# Patient Record
Sex: Male | Born: 1970 | Race: White | Hispanic: No | Smoking: Never smoker
Health system: Southern US, Community
[De-identification: ages and names within clinical notes are randomized; demographics above are authoritative.]

## PROBLEM LIST (undated history)

## (undated) DIAGNOSIS — K219 Gastro-esophageal reflux disease without esophagitis: Secondary | ICD-10-CM

## (undated) HISTORY — PX: HERNIA REPAIR: SHX51

## (undated) HISTORY — DX: Gastro-esophageal reflux disease without esophagitis: K21.9

---

## 2007-12-13 HISTORY — PX: VASECTOMY: SHX75

## 2010-11-17 ENCOUNTER — Inpatient Hospital Stay (HOSPITAL_COMMUNITY)
Admission: EM | Admit: 2010-11-17 | Discharge: 2010-11-19 | Payer: Self-pay | Attending: Internal Medicine | Admitting: Internal Medicine

## 2010-11-17 ENCOUNTER — Emergency Department (HOSPITAL_BASED_OUTPATIENT_CLINIC_OR_DEPARTMENT_OTHER)
Admission: EM | Admit: 2010-11-17 | Discharge: 2010-11-17 | Payer: Self-pay | Source: Home / Self Care | Admitting: Emergency Medicine

## 2010-11-18 ENCOUNTER — Encounter (INDEPENDENT_AMBULATORY_CARE_PROVIDER_SITE_OTHER): Payer: Self-pay | Admitting: Internal Medicine

## 2011-02-22 LAB — CBC
HCT: 44.5 % (ref 39.0–52.0)
Hemoglobin: 13.9 g/dL (ref 13.0–17.0)
Hemoglobin: 15.9 g/dL (ref 13.0–17.0)
MCH: 30.3 pg (ref 26.0–34.0)
MCH: 30.4 pg (ref 26.0–34.0)
MCH: 30.8 pg (ref 26.0–34.0)
MCHC: 34.8 g/dL (ref 30.0–36.0)
MCHC: 35.8 g/dL (ref 30.0–36.0)
Platelets: 181 10*3/uL (ref 150–400)
Platelets: 222 10*3/uL (ref 150–400)
RBC: 4.48 MIL/uL (ref 4.22–5.81)
RBC: 5.17 MIL/uL (ref 4.22–5.81)
WBC: 7.3 10*3/uL (ref 4.0–10.5)

## 2011-02-22 LAB — COMPREHENSIVE METABOLIC PANEL
ALT: 133 U/L — ABNORMAL HIGH (ref 0–53)
AST: 113 U/L — ABNORMAL HIGH (ref 0–37)
Albumin: 3.4 g/dL — ABNORMAL LOW (ref 3.5–5.2)
Alkaline Phosphatase: 53 U/L (ref 39–117)
BUN: 3 mg/dL — ABNORMAL LOW (ref 6–23)
CO2: 25 mEq/L (ref 19–32)
Calcium: 8.5 mg/dL (ref 8.4–10.5)
Calcium: 8.6 mg/dL (ref 8.4–10.5)
Calcium: 9.4 mg/dL (ref 8.4–10.5)
Chloride: 104 mEq/L (ref 96–112)
Chloride: 107 mEq/L (ref 96–112)
Creatinine, Ser: 1 mg/dL (ref 0.4–1.5)
GFR calc Af Amer: >60 mL/min (ref >60)
GFR calc Af Amer: >60 mL/min (ref >60)
GFR calc Af Amer: >60 mL/min (ref >60)
GFR calc non Af Amer: >60 mL/min (ref >60)
GFR calc non Af Amer: >60 mL/min (ref >60)
Glucose, Bld: 121 mg/dL — ABNORMAL HIGH (ref 70–99)
Glucose, Bld: 147 mg/dL — ABNORMAL HIGH (ref 70–99)
Potassium: 3.4 mEq/L — ABNORMAL LOW (ref 3.5–5.1)
Sodium: 144 mEq/L (ref 135–145)
Total Bilirubin: 0.7 mg/dL (ref 0.3–1.2)
Total Bilirubin: 1.1 mg/dL (ref 0.3–1.2)
Total Bilirubin: 1.9 mg/dL — ABNORMAL HIGH (ref 0.3–1.2)
Total Protein: 6.3 g/dL (ref 6.0–8.3)

## 2011-02-22 LAB — URINALYSIS, ROUTINE W REFLEX MICROSCOPIC
Glucose, UA: NEGATIVE mg/dL
Ketones, ur: NEGATIVE mg/dL
Nitrite: NEGATIVE
Urobilinogen, UA: 0.2 mg/dL (ref 0.0–1.0)
pH: 7 (ref 5.0–8.0)

## 2011-02-22 LAB — DIFFERENTIAL
Basophils Relative: 1 % (ref 0–1)
Eosinophils Relative: 3 % (ref 0–5)
Lymphs Abs: 2.6 10*3/uL (ref 0.7–4.0)
Monocytes Absolute: 0.5 10*3/uL (ref 0.1–1.0)
Neutro Abs: 4.5 10*3/uL (ref 1.7–7.7)
Neutrophils Relative %: 57 % (ref 43–77)

## 2011-02-22 LAB — LIPASE, BLOOD: Lipase: 24 U/L (ref 11–59)

## 2014-12-12 HISTORY — PX: SHOULDER ARTHROSCOPY W/ ROTATOR CUFF REPAIR: SHX2400

## 2021-09-29 ENCOUNTER — Encounter: Payer: Self-pay | Admitting: Gastroenterology

## 2021-11-01 ENCOUNTER — Telehealth: Payer: Self-pay

## 2021-11-01 NOTE — Telephone Encounter (Signed)
Call to pt x2 for 3pm previsit, call goes right to vm.  Lm for pt to call and r/s previsit by 5 or call and r/s previsit and colonoscopy after that.

## 2021-11-02 ENCOUNTER — Encounter: Payer: Self-pay | Admitting: Family Medicine

## 2021-11-02 ENCOUNTER — Encounter: Payer: Self-pay | Admitting: Gastroenterology

## 2021-11-15 ENCOUNTER — Encounter: Payer: Self-pay | Admitting: Gastroenterology

## 2021-12-21 ENCOUNTER — Ambulatory Visit (AMBULATORY_SURGERY_CENTER): Payer: 59 | Admitting: *Deleted

## 2021-12-21 ENCOUNTER — Other Ambulatory Visit: Payer: Self-pay

## 2021-12-21 VITALS — Ht 69.0 in | Wt 195.0 lb

## 2021-12-21 DIAGNOSIS — Z1211 Encounter for screening for malignant neoplasm of colon: Secondary | ICD-10-CM

## 2021-12-21 MED ORDER — NA SULFATE-K SULFATE-MG SULF 17.5-3.13-1.6 GM/177ML PO SOLN: 1.0000 | ORAL | 0 refills | Status: DC

## 2021-12-21 NOTE — Progress Notes (Signed)
Patient's pre-visit was done today over the phone with the patient. Name,DOB and address verified. Patient denies any allergies to Eggs and Soy. Patient denies any problems with anesthesia/sedation. Patient is not taking any diet pills or blood thinners. No home Oxygen.   Prep instructions sent to pt's email-pt aware. Patient understands to call us back with any questions or concerns. Patient is aware of our care-partner policy and [id number]-16 safety protocol.   The patient is COVID-19 vaccinated.

## 2021-12-28 ENCOUNTER — Encounter: Payer: Self-pay | Admitting: Gastroenterology

## 2022-01-04 ENCOUNTER — Encounter: Payer: Self-pay | Admitting: Gastroenterology

## 2022-01-04 ENCOUNTER — Ambulatory Visit (AMBULATORY_SURGERY_CENTER): Payer: 59 | Admitting: Gastroenterology

## 2022-01-04 ENCOUNTER — Other Ambulatory Visit: Payer: Self-pay

## 2022-01-04 VITALS — BP 123/90 | HR 77 | Temp 98.0°F | Resp 15 | Ht 69.0 in | Wt 195.0 lb

## 2022-01-04 DIAGNOSIS — D123 Benign neoplasm of transverse colon: Secondary | ICD-10-CM | POA: Diagnosis not present

## 2022-01-04 DIAGNOSIS — Z1211 Encounter for screening for malignant neoplasm of colon: Secondary | ICD-10-CM

## 2022-01-04 DIAGNOSIS — D127 Benign neoplasm of rectosigmoid junction: Secondary | ICD-10-CM | POA: Diagnosis not present

## 2022-01-04 MED ORDER — SODIUM CHLORIDE 0.9 % IV SOLN: 500.0000 mL | Freq: Once | INTRAVENOUS | Status: DC

## 2022-01-04 NOTE — Patient Instructions (Signed)
Discharge instructions given. Handouts on polyps and hemorrhoids. Resume previous medications. YOU HAD AN ENDOSCOPIC PROCEDURE TODAY AT THE [facility]:   Refer to the procedure report that was given to you for any specific questions about what was found during the examination.  If the procedure report does not answer your questions, please call your gastroenterologist to clarify.  If you requested that your care partner not be given the details of your procedure findings, then the procedure report has been included in a sealed envelope for you to review at your convenience later.  YOU SHOULD EXPECT: Some feelings of bloating in the abdomen. Passage of more gas than usual.  Walking can help get rid of the air that was put into your GI tract during the procedure and reduce the bloating. If you had a lower endoscopy (such as a colonoscopy or flexible sigmoidoscopy) you may notice spotting of blood in your stool or on the toilet paper. If you underwent a bowel prep for your procedure, you may not have a normal bowel movement for a few days.  Please Note:  You might notice some irritation and congestion in your nose or some drainage.  This is from the oxygen used during your procedure.  There is no need for concern and it should clear up in a day or so.  SYMPTOMS TO REPORT IMMEDIATELY:  Following lower endoscopy (colonoscopy or flexible sigmoidoscopy):  Excessive amounts of blood in the stool  Significant tenderness or worsening of abdominal pains  Swelling of the abdomen that is new, acute  Fever of 100F or higher   For urgent or emergent issues, a gastroenterologist can be reached at any hour by calling [phone]. Do not use MyChart messaging for urgent concerns.    DIET:  We do recommend a small meal at first, but then you may proceed to your regular diet.  Drink plenty of fluids but you should avoid alcoholic beverages for 24 hours.  ACTIVITY:  You should plan to take it  easy for the rest of today and you should NOT DRIVE or use heavy machinery until tomorrow (because of the sedation medicines used during the test).    FOLLOW UP: Our staff will call the number listed on your records 48-72 hours following your procedure to check on you and address any questions or concerns that you may have regarding the information given to you following your procedure. If we do not reach you, we will leave a message.  We will attempt to reach you two times.  During this call, we will ask if you have developed any symptoms of COVID 19. If you develop any symptoms (ie: fever, flu-like symptoms, shortness of breath, cough etc.) before then, please call [phone].  If you test positive for Covid 19 in the 2 weeks post procedure, please call and report this information to us.    If any biopsies were taken you will be contacted by phone or by letter within the next 1-3 weeks.  Please call us at [phone] if you have not heard about the biopsies in 3 weeks.    SIGNATURES/CONFIDENTIALITY: You and/or your care partner have signed paperwork which will be entered into your electronic medical record.  These signatures attest to the fact that that the information above on your After Visit Summary has been reviewed and is understood.  Full responsibility of the confidentiality of this discharge information lies with you and/or your care-partner.  

## 2022-01-04 NOTE — Op Note (Signed)
[facility] Patient Name: Richard Rowland Procedure Date: 01/04/2022 8:48 AM MRN: [id number] Endoscopist: Nicki Reaper E. Candis Rowland , MD Age: 51 Referring MD:  Date of Birth: 11-24-1971 Gender: Male Account #: [id number] Procedure:                Colonoscopy Indications:              Screening for colorectal malignant neoplasm, This                            is the patient's first colonoscopy Medicines:                Monitored Anesthesia Care Procedure:                Pre-Anesthesia Assessment:                           - Prior to the procedure, a History and Physical                            was performed, and patient medications and                            allergies were reviewed. The patient's tolerance of                            previous anesthesia was also reviewed. The risks                            and benefits of the procedure and the sedation                            options and risks were discussed with the patient.                            All questions were answered, and informed consent                            was obtained. Prior Anticoagulants: The patient has                            taken no previous anticoagulant or antiplatelet                            agents. ASA Grade Assessment: II - A patient with                            mild systemic disease. After reviewing the risks                            and benefits, the patient was deemed in                            satisfactory condition to undergo the procedure.  After obtaining informed consent, the colonoscope                            was passed under direct vision. Throughout the                            procedure, the patient's blood pressure, pulse, and                            oxygen saturations were monitored continuously. The                            Olympus CF-HQ190L (Serial# [id number]) Colonoscope was                            introduced through  the anus and advanced to the the                            terminal ileum, with identification of the                            appendiceal orifice and IC valve. The colonoscopy                            was performed without difficulty. The patient                            tolerated the procedure well. The quality of the                            bowel preparation was excellent. The terminal                            ileum, ileocecal valve, appendiceal orifice, and                            rectum were photographed. Scope In: 9:04:56 AM Scope Out: 9:24:09 AM Scope Withdrawal Time: 0 hours 15 minutes 30 seconds  Total Procedure Duration: 0 hours 19 minutes 13 seconds  Findings:                 The perianal and digital rectal examinations were                            normal. Pertinent negatives include normal                            sphincter tone and no palpable rectal lesions.                           A 5 mm polyp was found in the splenic flexure. The                            polyp was sessile. The polyp was removed with a  cold snare. Resection and retrieval were complete.                            Estimated blood loss was minimal.                           A 20 mm polypoid lesion was found in the                            recto-sigmoid colon. The lesion was pedunculated.                            No bleeding was present. This was biopsied with a                            cold forceps for histology. Estimated blood loss                            was minimal.                           The exam was otherwise normal throughout the                            examined colon.                           The terminal ileum appeared normal.                           Non-bleeding internal hemorrhoids were found during                            retroflexion. The hemorrhoids were Grade I                            (internal hemorrhoids that do not  prolapse).                           No additional abnormalities were found on                            retroflexion. Complications:            No immediate complications. Estimated Blood Loss:     Estimated blood loss was minimal. Impression:               - One 5 mm polyp at the splenic flexure, removed                            with a cold snare. Resected and retrieved.                           - Benign polypoid lesion in the recto-sigmoid  colon. Biopsied.                           - The examined portion of the ileum was normal.                           - Non-bleeding internal hemorrhoids. Recommendation:           - Patient has a contact number available for                            emergencies. The signs and symptoms of potential                            delayed complications were discussed with the                            patient. Return to normal activities tomorrow.                            Written discharge instructions were provided to the                            patient.                           - Resume previous diet.                           - Continue present medications.                           - Await pathology results.                           - Repeat colonoscopy date to be determined after                            pending pathology results are reviewed for                            surveillance based on pathology results.                           - If biopsies of rectosigmoid lesion are normal or                            demonstrate presence of subepithelial lesion, would                            recommend endoscopic ultrasound. If biopsies showe                            adenomatous changes, will plan for flexible                            sigmoidoscopy with  polypectomy Richard Paone E. Candis Schatz, MD 01/04/2022 9:31:41 AM This report has been signed electronically.

## 2022-01-04 NOTE — Progress Notes (Signed)
Called to room to assist during endoscopic procedure.  Patient ID and intended procedure confirmed with present staff. Received instructions for my participation in the procedure from the performing physician.  

## 2022-01-04 NOTE — Progress Notes (Signed)
Pt's states no medical or surgical changes since previsit or office visit.   Vs cw

## 2022-01-04 NOTE — Progress Notes (Signed)
Report to PACU, RN, vss, BBS= Clear.  

## 2022-01-04 NOTE — Progress Notes (Signed)
[facility] History and Physical   Primary Care Physician:  Pcp, No   Reason for Procedure:   Colon cancer screening  Plan:    Screening colonoscopy     HPI: Richard Rowland is a 51 y.o. male undergoing initial average risk screening colonoscopy.  He has no family history of colon cancer and no chronic GI symptoms. He has a history of symptomatic hemorrhoids s/p hemorrhoidectomy (2016?) and has had recurrence of bright red blood per rectum without other symptoms in the past year.   Past Medical History:  Diagnosis Date   GERD (gastroesophageal reflux disease)     Past Surgical History:  Procedure Laterality Date   HERNIA REPAIR     age 51   SHOULDER ARTHROSCOPY W/ ROTATOR CUFF REPAIR Right 2016   VASECTOMY  2009    Prior to Admission medications   Medication Sig Start Date End Date Taking? Authorizing Provider  famotidine (PEPCID) 20 MG tablet Take by mouth.   Yes [provider]  LORazepam (ATIVAN) 1 MG tablet Take 1-2 tabs (1-2mg ) po at HS prn sleep 08/31/21  Yes [provider]  Multiple Vitamin (MULTIVITAMIN) capsule Take 1 capsule by mouth every other day.   Yes [provider]  tadalafil (CIALIS) 5 MG tablet SMARTSIG:1-2 Tablet(s) By Mouth 12/13/21  Yes [provider]    Current Outpatient Medications  Medication Sig Dispense Refill   famotidine (PEPCID) 20 MG tablet Take by mouth.     LORazepam (ATIVAN) 1 MG tablet Take 1-2 tabs (1-2mg ) po at HS prn sleep     Multiple Vitamin (MULTIVITAMIN) capsule Take 1 capsule by mouth every other day.     tadalafil (CIALIS) 5 MG tablet SMARTSIG:1-2 Tablet(s) By Mouth     Current Facility-Administered Medications  Medication Dose Route Frequency Provider Last Rate Last Admin   0.9 %  sodium chloride infusion  500 mL Intravenous Once Daryel November, MD        Allergies as of 01/04/2022   (No Known Allergies)    Family History  Problem Relation Age of Onset   Colon  cancer Neg Hx    Colon polyps Neg Hx     Social History   Socioeconomic History   Marital status: Divorced    Spouse name: Not on file   Number of children: Not on file   Years of education: Not on file   Highest education level: Not on file  Occupational History   Not on file  Tobacco Use   Smoking status: Never   Smokeless tobacco: Never  Vaping Use   Vaping Use: Never used  Substance and Sexual Activity   Alcohol use: Yes    Alcohol/week: 1.0 standard drink    Types: 1 Glasses of wine per week   Drug use: Not Currently    Comment: THC occ   Sexual activity: Not on file  Other Topics Concern   Not on file  Social History Narrative   Not on file   Social Determinants of Health   Financial Resource Strain: Not on file  Food Insecurity: Not on file  Transportation Needs: Not on file  Physical Activity: Not on file  Stress: Not on file  Social Connections: Not on file  Intimate Partner Violence: Not on file    Review of Systems:  All other review of systems negative except as mentioned in the HPI.  Physical Exam: Vital signs BP (!) 127/93    Pulse 88    Temp 98 F (  36.7 C)    Resp 11    Ht 5\' 9"  (1.753 m)    Wt 195 lb (88.5 kg)    SpO2 97%    BMI 28.80 kg/m   General:   Alert,  Well-developed, well-nourished, pleasant and cooperative in NAD Airway:  Mallampati 1 Lungs:  Clear throughout to auscultation.   Heart:  Regular rate and rhythm; no murmurs, clicks, rubs,  or gallops. Abdomen:  Soft, nontender and nondistended. Normal bowel sounds.   Neuro/Psych:  Normal mood and affect. A and O x 3   Julianne Chamberlin E. Candis Schatz, MD [facility]

## 2022-01-06 ENCOUNTER — Telehealth: Payer: Self-pay

## 2022-01-06 ENCOUNTER — Telehealth: Payer: Self-pay | Admitting: *Deleted

## 2022-01-06 NOTE — Telephone Encounter (Signed)
°  Follow up Call-  Call back number 01/04/2022  Post procedure Call Back phone  # [phone]  Permission to leave phone message Yes  Some recent data might be hidden    No answer at 2nd attempt follow up phone call.  Left message on voicemail.

## 2022-01-06 NOTE — Telephone Encounter (Signed)
Attempted f/u call. No answer, left VM. 

## 2022-01-13 ENCOUNTER — Telehealth: Payer: Self-pay

## 2022-01-13 ENCOUNTER — Other Ambulatory Visit: Payer: Self-pay

## 2022-01-13 ENCOUNTER — Encounter: Payer: Self-pay | Admitting: Gastroenterology

## 2022-01-13 DIAGNOSIS — K639 Disease of intestine, unspecified: Secondary | ICD-10-CM

## 2022-01-13 NOTE — Progress Notes (Signed)
Vaughan Basta,  Can you please get Mr. Richard Rowland set up for a CT scan of the abdomen/pelvis with IV/PO contrast to evaluate a large subepithelial polypoid lesion in the rectosigmoid colon found on colonoscopy?  Thanks

## 2022-01-13 NOTE — Telephone Encounter (Signed)
-----   Message from Daryel November, MD sent at 01/13/2022  3:34 PM EST ----- Vaughan Basta,  Can you please get Mr. Haggar set up for a CT scan of the abdomen/pelvis with IV/PO contrast to evaluate a large subepithelial polypoid lesion in the rectosigmoid colon found on colonoscopy?  Thanks

## 2022-01-28 ENCOUNTER — Ambulatory Visit (HOSPITAL_COMMUNITY)
Admission: RE | Admit: 2022-01-28 | Discharge: 2022-01-28 | Disposition: A | Discharge status: Home / Self Care | Payer: 59 | Source: Ambulatory Visit | Attending: Gastroenterology | Admitting: Gastroenterology

## 2022-01-28 ENCOUNTER — Other Ambulatory Visit: Payer: Self-pay

## 2022-01-28 DIAGNOSIS — K639 Disease of intestine, unspecified: Secondary | ICD-10-CM | POA: Diagnosis not present

## 2022-01-28 MED ORDER — SODIUM CHLORIDE (PF) 0.9 % IJ SOLN
INTRAMUSCULAR | Status: AC
  Filled 2022-01-28: qty 50

## 2022-01-28 MED ORDER — IOHEXOL 300 MG/ML  SOLN
100.0000 mL | Freq: Once | INTRAMUSCULAR | Status: AC | PRN
  Administered 2022-01-28: 100 mL via INTRAVENOUS

## 2022-02-02 ENCOUNTER — Telehealth: Payer: Self-pay

## 2022-02-02 NOTE — Progress Notes (Signed)
Mr. Carignan,  Rene Paci tried calling you a couple of times today, but wasn't able to reach you.  Your CT did not show any unexpected or concerning findings.  The large lesion in the distal colon/proximal rectum was confirmed.  There were no lymph nodes.  I have discussed your case with Drs. Mansouraty and Ardis Hughs and they have recommended proceeding with an endoscopic ultrasound to further characterize the lesion, and get tissue samples from the deeper layers to hopefully get an actual diagnosis of what is.   Based on this information, Dr. Rush Landmark would determine what type of resection would be best. You will be contacted by one of our nurses to schedule the endoscopic ultrasound.  It is similar to the colonoscopy you had, but uses a specialized endoscope with an ultrasound probe to visualize deeper layers of the polyp. Please let me know if you have further questions.

## 2022-02-02 NOTE — Telephone Encounter (Signed)
-----   Message from Daryel November, MD sent at 02/02/2022 12:49 PM EST ----- Regarding: RE: Follow-up on patient Of course.  Totally defer to you guys on this.  Plan for EUS and figure out what it is, then figure out best method to resect.   Thanks again for the time and brain energy.  I will call him today and let him know that we will be getting him set up for the EUS.  ----- Message ----- From: Milus Banister, MD Sent: 02/02/2022   7:15 AM EST To: Timothy Lasso, RN, Irving Copas., MD, # Subject: RE: Follow-up on patient                       I agree, the first step is knowing what this lesion is made of.  EUS with radial and linear.  Given his endoscopic appearance I am not sure that I would be comfortable trying to remove it even if it only involves the superficial layers of the rectal wall.  Certainly happy to help.  Scott, How does this sound.  If agreeable then send back to [place] and we can let [place] know to get on the schedule.  Thanks   ----- Message ----- From: Irving Copas., MD Sent: 02/02/2022   5:00 AM EST To: Milus Banister, MD, Daryel November, MD Subject: Follow-up on patient                           [facility], I thought about this a little bit more and I think for consideration of any type of intervention, the endoscopic ultrasound and potential biopsy attempt if not clearly defined as a lipoma is reasonable.  If there is any way that this involves the deep submucosa or the MP then certainly an advanced resection such as a STER procedure would need to be considered and I am not sure, based on the size, that an FT RD could take care of this though it may be possible. Thus I think probably first an EUS and trying to get a biopsy would be most ideal. Thoughts DJ? Thanks. GM

## 2022-02-04 ENCOUNTER — Other Ambulatory Visit: Payer: Self-pay

## 2022-02-04 DIAGNOSIS — K639 Disease of intestine, unspecified: Secondary | ICD-10-CM

## 2022-02-04 NOTE — Telephone Encounter (Signed)
Got it. GM

## 2022-02-04 NOTE — Telephone Encounter (Signed)
EUS scheduled, pt instructed and medications reviewed.  Patient instructions mailed to home and sent to My Chart .  Patient to call with any questions or concerns.  

## 2022-02-04 NOTE — Telephone Encounter (Signed)
Lower EUS has been scheduled for 03/28/22 at [facility] with GM at 12 noon.  Left message on machine to call back

## 2022-02-04 NOTE — Telephone Encounter (Signed)
Will this be a lower EUS?

## 2022-02-10 ENCOUNTER — Encounter: Payer: Self-pay | Admitting: Gastroenterology

## 2022-03-23 ENCOUNTER — Encounter (HOSPITAL_COMMUNITY): Payer: Self-pay | Admitting: Gastroenterology

## 2022-03-23 NOTE — Progress Notes (Signed)
Attempted to obtain medical history via telephone, unable to reach at this time. I left a voicemail to return pre surgical testing department's phone call.  

## 2022-03-28 ENCOUNTER — Ambulatory Visit (HOSPITAL_COMMUNITY)
Admission: RE | Admit: 2022-03-28 | Discharge: 2022-03-28 | Disposition: A | Discharge status: Home / Self Care | Payer: 59 | Source: Ambulatory Visit | Attending: Gastroenterology | Admitting: Gastroenterology

## 2022-03-28 ENCOUNTER — Other Ambulatory Visit: Payer: Self-pay

## 2022-03-28 ENCOUNTER — Encounter (HOSPITAL_COMMUNITY): Payer: Self-pay | Admitting: Gastroenterology

## 2022-03-28 ENCOUNTER — Ambulatory Visit (HOSPITAL_COMMUNITY): Payer: 59 | Admitting: Certified Registered Nurse Anesthetist

## 2022-03-28 ENCOUNTER — Encounter (HOSPITAL_COMMUNITY)
Admission: RE | Disposition: A | Discharge status: Home / Self Care | Payer: Self-pay | Source: Ambulatory Visit | Attending: Gastroenterology

## 2022-03-28 ENCOUNTER — Ambulatory Visit (HOSPITAL_BASED_OUTPATIENT_CLINIC_OR_DEPARTMENT_OTHER): Payer: 59 | Admitting: Certified Registered Nurse Anesthetist

## 2022-03-28 DIAGNOSIS — D490 Neoplasm of unspecified behavior of digestive system: Secondary | ICD-10-CM | POA: Insufficient documentation

## 2022-03-28 DIAGNOSIS — I899 Noninfective disorder of lymphatic vessels and lymph nodes, unspecified: Secondary | ICD-10-CM | POA: Insufficient documentation

## 2022-03-28 DIAGNOSIS — K641 Second degree hemorrhoids: Secondary | ICD-10-CM | POA: Diagnosis not present

## 2022-03-28 DIAGNOSIS — K6389 Other specified diseases of intestine: Secondary | ICD-10-CM

## 2022-03-28 DIAGNOSIS — K5669 Other partial intestinal obstruction: Secondary | ICD-10-CM | POA: Diagnosis not present

## 2022-03-28 DIAGNOSIS — K639 Disease of intestine, unspecified: Secondary | ICD-10-CM | POA: Diagnosis present

## 2022-03-28 DIAGNOSIS — K644 Residual hemorrhoidal skin tags: Secondary | ICD-10-CM | POA: Diagnosis not present

## 2022-03-28 DIAGNOSIS — E119 Type 2 diabetes mellitus without complications: Secondary | ICD-10-CM | POA: Insufficient documentation

## 2022-03-28 DIAGNOSIS — K635 Polyp of colon: Secondary | ICD-10-CM | POA: Insufficient documentation

## 2022-03-28 HISTORY — PX: FINE NEEDLE ASPIRATION BIOPSY: CATH118315

## 2022-03-28 HISTORY — PX: POLYPECTOMY: SHX5525

## 2022-03-28 HISTORY — PX: SUBMUCOSAL TATTOO INJECTION: SHX6856

## 2022-03-28 HISTORY — PX: FLEXIBLE SIGMOIDOSCOPY: SHX5431

## 2022-03-28 HISTORY — PX: EUS: SHX5427

## 2022-03-28 LAB — GLUCOSE, CAPILLARY: Glucose-Capillary: 133 mg/dL — ABNORMAL HIGH (ref 70–99)

## 2022-03-28 SURGERY — ULTRASOUND, LOWER GI TRACT, ENDOSCOPIC
Anesthesia: Monitor Anesthesia Care

## 2022-03-28 MED ORDER — PROPOFOL 1000 MG/100ML IV EMUL
INTRAVENOUS | Status: AC
Start: 2022-03-28 — End: ?
  Filled 2022-03-28: qty 300

## 2022-03-28 MED ORDER — CIPROFLOXACIN HCL 500 MG PO TABS: 500.0000 mg | ORAL_TABLET | Freq: Two times a day (BID) | ORAL | 0 refills | Status: AC

## 2022-03-28 MED ORDER — LACTATED RINGERS IV SOLN: INTRAVENOUS | Status: DC

## 2022-03-28 MED ORDER — SPOT INK MARKER SYRINGE KIT
PACK | SUBMUCOSAL | Status: DC | PRN
  Administered 2022-03-28: 3 mL via SUBMUCOSAL

## 2022-03-28 MED ORDER — SODIUM CHLORIDE 0.9 % IV SOLN: INTRAVENOUS | Status: DC

## 2022-03-28 MED ORDER — CIPROFLOXACIN IN D5W 400 MG/200ML IV SOLN
INTRAVENOUS | Status: AC
  Filled 2022-03-28: qty 200

## 2022-03-28 MED ORDER — CIPROFLOXACIN IN D5W 400 MG/200ML IV SOLN
INTRAVENOUS | Status: DC | PRN
  Administered 2022-03-28: 400 mg via INTRAVENOUS

## 2022-03-28 MED ORDER — PROPOFOL 10 MG/ML IV BOLUS
INTRAVENOUS | Status: DC | PRN
  Administered 2022-03-28: 30 mg via INTRAVENOUS
  Administered 2022-03-28: 20 mg via INTRAVENOUS
  Administered 2022-03-28: 30 mg via INTRAVENOUS

## 2022-03-28 MED ORDER — ONDANSETRON HCL 4 MG/2ML IJ SOLN
INTRAMUSCULAR | Status: DC | PRN
  Administered 2022-03-28: 4 mg via INTRAVENOUS

## 2022-03-28 MED ORDER — PROPOFOL 10 MG/ML IV BOLUS
INTRAVENOUS | Status: AC
  Filled 2022-03-28: qty 20

## 2022-03-28 MED ORDER — PROPOFOL 500 MG/50ML IV EMUL
INTRAVENOUS | Status: DC | PRN
  Administered 2022-03-28: 150 ug/kg/min via INTRAVENOUS

## 2022-03-28 NOTE — Anesthesia Preprocedure Evaluation (Addendum)
Anesthesia Evaluation  ?Patient identified by MRN, date of birth, ID band ?Patient awake ? ? ? ?Reviewed: ?Allergy & Precautions, NPO status , Patient's Chart, lab work & pertinent test results ? ?Airway ?Mallampati: II ? ?TM Distance: >3 FB ?Neck ROM: Full ? ? ? Dental ?no notable dental hx. ?(+) Dental Advisory Given, Teeth Intact ?  ?Pulmonary ?neg pulmonary ROS,  ?  ?Pulmonary exam normal ?breath sounds clear to auscultation ? ? ? ? ? ? Cardiovascular ?negative cardio ROS ?Normal cardiovascular exam ?Rhythm:Regular Rate:Normal ? ? ?  ?Neuro/Psych ?negative neurological ROS ?   ? GI/Hepatic ?Neg liver ROS, GERD  ,  ?Endo/Other  ?diabetes ? Renal/GU ?negative Renal ROS  ? ?  ?Musculoskeletal ?negative musculoskeletal ROS ?(+)  ? Abdominal ?  ?Peds ? Hematology ?negative hematology ROS ?(+)   ?Anesthesia Other Findings ? ? Reproductive/Obstetrics ? ?  ? ? ? ? ? ? ? ? ? ? ? ? ? ?  ?  ? ? ? ? ? ? ? ?Anesthesia Physical ?Anesthesia Plan ? ?ASA: 2 ? ?Anesthesia Plan: MAC  ? ?Post-op Pain Management:   ? ?Induction: Intravenous ? ?PONV Risk Score and Plan: 1 and Propofol infusion, Treatment may vary due to age or medical condition and TIVA ? ?Airway Management Planned: Natural Airway ? ?Additional Equipment:  ? ?Intra-op Plan:  ? ?Post-operative Plan:  ? ?Informed Consent: I have reviewed the patients History and Physical, chart, labs and discussed the procedure including the risks, benefits and alternatives for the proposed anesthesia with the patient or authorized representative who has indicated his/her understanding and acceptance.  ? ? ? ?Dental advisory given ? ?Plan Discussed with: CRNA ? ?Anesthesia Plan Comments:   ? ? ? ? ? ?Anesthesia Quick Evaluation ? ?

## 2022-03-28 NOTE — Anesthesia Procedure Notes (Addendum)
Procedure Name: [place] ?Date/Time: 03/28/2022 1:27 PM ?Performed by: West Pugh, CRNA ?Pre-anesthesia Checklist: Patient identified, Emergency Drugs available, Suction available, Patient being monitored and Timeout performed ?Patient Re-evaluated:Patient Re-evaluated prior to induction ?Oxygen Delivery Method: Simple face mask ?Preoxygenation: Pre-oxygenation with 100% oxygen ?Induction Type: IV induction ?Placement Confirmation: positive ETCO2 ?Dental Injury: Teeth and Oropharynx as per pre-operative assessment  ? ? ? ? ?

## 2022-03-28 NOTE — Op Note (Signed)
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                          tumor versus extrinsic compression ?Providers:                Justice Britain, MD, Jeanella Cara, RN,  ?                          Tyna Jaksch Technician ?Referring MD:             Gladstone Pih. Candis Schatz, MD, [place] ?Medicines:                Monitored Anesthesia Care, Cipro 400 mg IV ?Complications:            No immediate complications. ?Estimated Blood Loss:     Estimated blood loss was minimal. ?Procedure:                Pre-Anesthesia Assessment: ?                          - Prior to the procedure, a History and Physical  ?                          was performed, and patient medications and  ?                          allergies were reviewed. The patient's tolerance of  ?                          previous anesthesia was also reviewed. The risks  ?                          and benefits of the procedure and the sedation  ?                          options and risks were discussed with the patient.  ?                          All questions were answered, and informed consent  ?                          was obtained. Prior Anticoagulants: The patient has  ?                          taken no previous anticoagulant or antiplatelet  ?                          agents. ASA Grade Assessment: II - A patient with  ?  mild systemic disease. After reviewing the risks  ?                          and benefits, the patient was deemed in  ?                          satisfactory condition to undergo the procedure. ?                          After obtaining informed consent, the endoscope was  ?                          passed under  direct vision. Throughout the  ?                          procedure, the patient's blood pressure, pulse, and  ?                          oxygen saturations were monitored continuously. The  ?                          [id number]-H190 ([id number]) Olympus endoscope was introduced  ?                          through the anus and advanced to the the descending  ?                          colon for ultrasound. The GF-UE190-AL5 ([id number])  ?                          Olympus radial ultrasound scope was introduced  ?                          through the anus and advanced to the the sigmoid  ?                          colon for ultrasound. The [id number] ([id number])  ?                          Olympus linear ultrasound scope was introduced  ?                          through the anus and advanced to the the  ?                          rectosigmoid junction for ultrasound. The lower EUS  ?                          was accomplished without difficulty. The patient  ?                          tolerated the procedure. The quality of the bowel  ?  preparation was fair. ?Scope In: ?Scope Out: ?Findings: ?     ENDOSCOPIC FINDING: : ?     Normal mucosa was found in the left colon. ?     A 3 mm polyp was found in the recto-sigmoid colon. The polyp was  ?     sessile. The polyp was removed with a cold snare. Resection and  ?     retrieval were complete. ?     A submucosal, pedunculated appearing partially obstructing medium-sized  ?     lesion was found in the recto-sigmoid colon (approximately 13 cm from  ?     the anal os. In addition, its diameter measured approximately thirty mm.  ?     No bleeding was present but evidence of minor ulceration (likely from  ?     previous biopsies) was noted. After the rest of the procedure was  ?     complete, an area just distal to the lesionwas tattooed with an  ?     injection of Spot (carbon black). ?     Non-bleeding non-thrombosed external and internal hemorrhoids were found  ?      during retroflexion, during perianal exam and during digital exam. The  ?     hemorrhoids were Grade II (internal hemorrhoids that prolapse but reduce  ?     spontaneously). ?     ENDOSONOGRAPHIC FINDING: : ?     A lobulated and pedunculated intramural (subepithelial) lesion was found  ?     at the rectosigmoid junction. The lesion was hypoechoic.  ?     Sonographically, the origin appeared to be within the muscularis propria  ?     (Layer 4) and involute the area into this large pedunuculation. No  ?     additional wall layers were involved with the main lesion, however, the  ?     small stalk area does appear to involve Layer 1/2/3/4. The lesion  ?     measured up to 30 mm in thickness. The endosonographic borders were  ?     smooth. Fine needle biopsy was performed in effort of further defining  ?     this lesion. Color Doppler imaging was utilized prior to needle puncture  ?     to confirm a lack of significant vascular structures within the needle  ?     path. Eight passes were made with the 22 gauge SharkCore biopsy needle  ?     using a transcolonic approach. Visible cores of tissue were obtained.  ?     Touch preps were performed. Final cytology results are pending. One  ?     pass, I performed attempt at aspiration of any fluid, and that did not  ?     seem to bring any fluid out of the lesion. ?     The rectum was otherwise normal. ?     The perirectal space was normal. ?     No malignant-appearing lymph nodes were visualized in the perirectal  ?     region and in the left iliac region. The nodes were. ?     The internal anal sphincter was visualized endosonographically and  ?     appeared normal. ?Impression:               FLEX Impression: ?                          -  Preparation of the colon was fair. ?                          - Normal mucosa in the left colon othewise. ?                          - One 3 mm polyp at the recto-sigmoid colon,  ?                          removed with a cold snare. Resected  and retrieved. ?                          - Subepithelial, pedunculated appearing partially  ?                          obstructing lesion in the recto-sigmoid colon (~13  ?                          cm from anal os). Area of tattoo placed just distal  ?                          to the lesion was placed. ?                          - Non-bleeding non-thrombosed external and internal  ?                          hemorrhoids. ?                          EUS Impression: ?                          - An intramural (subepithelial) lesion was  ?                          visualized endosonographically at the rectosigmoid  ?                          junction. Sonographically, the origin appeared to  ?                          be within the muscularis propria (Layer 4).  ?                          However, the pedunculation seems to involute Layers  ?                          1/2/3/4 into the pedunculation. Cytology results  ?                          are pending. However, the endosonographic  ?                          appearance is suggestive of a stromal cell (smooth  ?  muscle) lesion. Fine needle biopsy performed. ?                          - Endosonographic images of the rectum were  ?                          unremarkable. ?                          - Endosonographic images of the perirectal space  ?                          were unremarkable. ?                          - No malignant-appearing lymph nodes were  ?                          visualized endosonographically in the perirectal  ?                          region and in the left iliac region. ?                          - The internal anal sphincter was visualized  ?                          endosonographically and appeared normal. ?Moderate Sedation: ?     Not Applicable - Patient had care per Anesthesia. ?Recommendation:           - The patient will be observed post-procedure,  ?                          until all discharge criteria are  met. ?                          - Discharge patient to home. ?                          - Patient has a contact number available for  ?                          emergencies. The signs and symptoms of p

## 2022-03-28 NOTE — Transfer of Care (Signed)
Immediate Anesthesia Transfer of Care Note ? ?Patient: Abass Misener ? ?Procedure(s) Performed: LOWER ENDOSCOPIC ULTRASOUND (EUS) ?POLYPECTOMY ?FINE NEEDLE ASPIRATION BIOPSY ?SUBMUCOSAL TATTOO INJECTION ? ?Patient Location: PACU and Endoscopy Unit ? ?Anesthesia Type:MAC ? ?Level of Consciousness: drowsy ? ?Airway & Oxygen Therapy: Patient Spontanous Breathing and Patient connected to face mask oxygen ? ?Post-op Assessment: Report given to RN and Post -op Vital signs reviewed and stable ? ?Post vital signs: Reviewed and stable ? ?Last Vitals:  ?Vitals Value Taken Time  ?BP    ?Temp    ?Pulse 64 03/28/22 1435  ?Resp 14 03/28/22 1435  ?SpO2 100 % 03/28/22 1435  ?Vitals shown include unvalidated device data. ? ?Last Pain:  ?Vitals:  ? 03/28/22 1107  ?TempSrc: Oral  ?PainSc: 0-No pain  ?   ? ?  ? ?Complications: No notable events documented. ?

## 2022-03-28 NOTE — Anesthesia Postprocedure Evaluation (Signed)
Anesthesia Post Note ? ?Patient: Eugen Jeansonne ? ?Procedure(s) Performed: LOWER ENDOSCOPIC ULTRASOUND (EUS) ?POLYPECTOMY ?FINE NEEDLE ASPIRATION BIOPSY ?SUBMUCOSAL TATTOO INJECTION ? ?  ? ?Patient location during evaluation: Endoscopy ?Anesthesia Type: MAC ?Level of consciousness: awake and alert ?Pain management: pain level controlled ?Vital Signs Assessment: post-procedure vital signs reviewed and stable ?Respiratory status: spontaneous breathing ?Cardiovascular status: stable ?Anesthetic complications: no ? ? ?No notable events documented. ? ?Last Vitals:  ?Vitals:  ? 03/28/22 1107 03/28/22 1435  ?BP: (!) 137/94   ?Pulse: 78 (!) 50  ?Resp: 14 12  ?Temp: 36.7 ?C 36.6 ?C  ?SpO2: 97% 100%  ?  ?Last Pain:  ?Vitals:  ? 03/28/22 1435  ?TempSrc: Temporal  ?PainSc: 0-No pain  ? ? ?  ?  ?  ?  ?  ?  ? ?Nolon Nations ? ? ? ? ?

## 2022-03-28 NOTE — H&P (Signed)
? ?GASTROENTEROLOGY PROCEDURE H&P NOTE  ? ?Primary Care Physician: ?Jettie Booze, NP ? ?HPI: ?Richard Rowland is a 51 y.o. male who presents for Flexible sigmoidoscopy with lower endoscopic ultrasound to better define Rectosigmoid subepithelial lesion. ? ?Past Medical History:  ?Diagnosis Date  ? GERD (gastroesophageal reflux disease)   ? ?Past Surgical History:  ?Procedure Laterality Date  ? HERNIA REPAIR    ? age 28  ? SHOULDER ARTHROSCOPY W/ ROTATOR CUFF REPAIR Right 2016  ? VASECTOMY  2009  ? ?Current Facility-Administered Medications  ?Medication Dose Route Frequency Provider Last Rate Last Admin  ? 0.9 %  sodium chloride infusion   Intravenous Continuous Mansouraty, Telford Nab., MD      ? lactated ringers infusion   Intravenous Continuous Mansouraty, Telford Nab., MD 10 mL/hr at 03/28/22 1114 New Bag at 03/28/22 1114  ? ? ?Current Facility-Administered Medications:  ?  0.9 %  sodium chloride infusion, , Intravenous, Continuous, Mansouraty, Telford Nab., MD ?  lactated ringers infusion, , Intravenous, Continuous, Mansouraty, Telford Nab., MD, Last Rate: 10 mL/hr at 03/28/22 1114, New Bag at 03/28/22 1114 ?No Known Allergies ?Family History  ?Problem Relation Age of Onset  ? Colon cancer Neg Hx   ? Colon polyps Neg Hx   ? ?Social History  ? ?Socioeconomic History  ? Marital status: Divorced  ?  Spouse name: Not on file  ? Number of children: Not on file  ? Years of education: Not on file  ? Highest education level: Not on file  ?Occupational History  ? Not on file  ?Tobacco Use  ? Smoking status: Never  ? Smokeless tobacco: Never  ?Vaping Use  ? Vaping Use: Never used  ?Substance and Sexual Activity  ? Alcohol use: Yes  ?  Alcohol/week: 1.0 standard drink  ?  Types: 1 Glasses of wine per week  ? Drug use: Not Currently  ?  Comment: THC occ  ? Sexual activity: Not on file  ?Other Topics Concern  ? Not on file  ?Social History Narrative  ? Not on file  ? ?Social Determinants of Health  ? ?Financial Resource  Strain: Not on file  ?Food Insecurity: Not on file  ?Transportation Needs: Not on file  ?Physical Activity: Not on file  ?Stress: Not on file  ?Social Connections: Not on file  ?Intimate Partner Violence: Not on file  ? ? ?Physical Exam: ?Today's Vitals  ? 03/28/22 1107  ?BP: (!) 137/94  ?Pulse: 78  ?Resp: 14  ?Temp: 98.1 ?F (36.7 ?C)  ?TempSrc: Oral  ?SpO2: 97%  ?Weight: 88.5 kg  ?Height: '5\' 9"'$  (1.753 m)  ?PainSc: 0-No pain  ? ?Body mass index is 28.81 kg/m?. ?GEN: NAD ?EYE: Sclerae anicteric ?ENT: MMM ?CV: Non-tachycardic ?GI: Soft, NT/ND ?NEURO:  Alert & Oriented x 3 ? ?Lab Results: ?No results for input(s): WBC, HGB, HCT, PLT in the last 72 hours. ?BMET ?No results for input(s): NA, K, CL, CO2, GLUCOSE, BUN, CREATININE, CALCIUM in the last 72 hours. ?LFT ?No results for input(s): PROT, ALBUMIN, AST, ALT, ALKPHOS, BILITOT, BILIDIR, IBILI in the last 72 hours. ?PT/INR ?No results for input(s): LABPROT, INR in the last 72 hours. ? ? ?Impression / Plan: ?This is a 51 y.o.male who presents for Flexible sigmoidoscopy with lower endoscopic ultrasound to better define Rectosigmoid subepithelial lesion. ? ?The risks of an EUS including intestinal perforation, bleeding, infection, aspiration, and medication effects were discussed as was the possibility it may not give a definitive diagnosis if a biopsy is  performed. ? ? ?The risks and benefits of endoscopic evaluation/treatment were discussed with the patient and/or family; these include but are not limited to the risk of perforation, infection, bleeding, missed lesions, lack of diagnosis, severe illness requiring hospitalization, as well as anesthesia and sedation related illnesses.  The patient's history has been reviewed, patient examined, no change in status, and deemed stable for procedure.  The patient and/or family is agreeable to proceed.  ? ? ?Justice Britain, MD ?[facility] ?Advanced Endoscopy ?Office # [phone] ? ?

## 2022-03-29 ENCOUNTER — Encounter: Payer: Self-pay | Admitting: Gastroenterology

## 2022-03-29 ENCOUNTER — Encounter (HOSPITAL_COMMUNITY): Payer: Self-pay | Admitting: Gastroenterology

## 2022-03-29 LAB — SURGICAL PATHOLOGY

## 2022-03-29 LAB — CYTOLOGY - NON PAP

## 2022-03-30 ENCOUNTER — Other Ambulatory Visit: Payer: Self-pay

## 2022-03-30 DIAGNOSIS — K639 Disease of intestine, unspecified: Secondary | ICD-10-CM

## 2022-03-30 DIAGNOSIS — K629 Disease of anus and rectum, unspecified: Secondary | ICD-10-CM

## 2022-04-13 ENCOUNTER — Encounter: Payer: Self-pay | Admitting: Gastroenterology

## 2022-04-13 ENCOUNTER — Other Ambulatory Visit: Payer: Self-pay

## 2022-04-13 NOTE — Progress Notes (Signed)
Case discussed at [facility] today. ?After review of the imaging and with our surgical colleagues, it is felt that another attempt at FNB/fine-needle biopsy would be ideal prior to surgical evaluation for resection. ?I did discuss my concern about the particular location and where this lesion seems to be originating from in regards to layer four (muscularis propria). ?Plan to proceed with the scheduled EUS and repeat biopsy attempt with larger 19-gauge needle before entertaining surgical resection. ? ?We will update the patient. ? ?Justice Britain, MD ?[facility] ?Advanced Endoscopy ?Office # [phone] ?

## 2022-04-13 NOTE — Progress Notes (Signed)
The proposed treatment discussed in conference is for discussion purpose only and is not a binding recommendation.  The patients have not been physically examined, or presented with their treatment options.  Therefore, final treatment plans cannot be decided.  

## 2022-04-18 NOTE — Progress Notes (Signed)
The patient has been notified of this information and all questions answered.

## 2022-05-03 ENCOUNTER — Encounter (HOSPITAL_COMMUNITY): Payer: Self-pay | Admitting: Gastroenterology

## 2022-05-03 NOTE — Progress Notes (Signed)
Attempted to obtain medical history via telephone, unable to reach at this time. HIPAA compliant voicemail message left requesting return call to pre surgical testing department. 

## 2022-05-11 ENCOUNTER — Telehealth: Payer: Self-pay | Admitting: Gastroenterology

## 2022-05-11 NOTE — Telephone Encounter (Signed)
Richard Rowland this is the pt we are waiting to get insurance information on.  Can you call him and get the information you need?

## 2022-05-11 NOTE — Telephone Encounter (Signed)
Inbound call from patient inquiring about ''clearance'' issue for an upcoming procedure on 05/12/22. Patient is aware he has to submit new insurance info. Patient is waiting on a nurse to give him a call back so that he can submit new insurance information. Please give patient a call back to advise.  Thank You.

## 2022-05-12 ENCOUNTER — Ambulatory Visit (HOSPITAL_BASED_OUTPATIENT_CLINIC_OR_DEPARTMENT_OTHER): Payer: 59 | Admitting: Anesthesiology

## 2022-05-12 ENCOUNTER — Encounter (HOSPITAL_COMMUNITY): Payer: Self-pay | Admitting: Gastroenterology

## 2022-05-12 ENCOUNTER — Other Ambulatory Visit: Payer: Self-pay

## 2022-05-12 ENCOUNTER — Encounter (HOSPITAL_COMMUNITY)
Admission: RE | Disposition: A | Discharge status: Home / Self Care | Payer: Self-pay | Source: Ambulatory Visit | Attending: Gastroenterology

## 2022-05-12 ENCOUNTER — Ambulatory Visit (HOSPITAL_COMMUNITY)
Admission: RE | Admit: 2022-05-12 | Discharge: 2022-05-12 | Disposition: A | Discharge status: Home / Self Care | Payer: 59 | Source: Ambulatory Visit | Attending: Gastroenterology | Admitting: Gastroenterology

## 2022-05-12 ENCOUNTER — Ambulatory Visit (HOSPITAL_COMMUNITY): Payer: 59 | Admitting: Anesthesiology

## 2022-05-12 DIAGNOSIS — K641 Second degree hemorrhoids: Secondary | ICD-10-CM | POA: Insufficient documentation

## 2022-05-12 DIAGNOSIS — K449 Diaphragmatic hernia without obstruction or gangrene: Secondary | ICD-10-CM

## 2022-05-12 DIAGNOSIS — K5730 Diverticulosis of large intestine without perforation or abscess without bleeding: Secondary | ICD-10-CM | POA: Diagnosis not present

## 2022-05-12 DIAGNOSIS — K2970 Gastritis, unspecified, without bleeding: Secondary | ICD-10-CM | POA: Diagnosis not present

## 2022-05-12 DIAGNOSIS — K644 Residual hemorrhoidal skin tags: Secondary | ICD-10-CM | POA: Insufficient documentation

## 2022-05-12 DIAGNOSIS — D490 Neoplasm of unspecified behavior of digestive system: Secondary | ICD-10-CM | POA: Diagnosis not present

## 2022-05-12 DIAGNOSIS — K629 Disease of anus and rectum, unspecified: Secondary | ICD-10-CM

## 2022-05-12 DIAGNOSIS — K6389 Other specified diseases of intestine: Secondary | ICD-10-CM | POA: Insufficient documentation

## 2022-05-12 DIAGNOSIS — K2289 Other specified disease of esophagus: Secondary | ICD-10-CM

## 2022-05-12 DIAGNOSIS — K639 Disease of intestine, unspecified: Secondary | ICD-10-CM

## 2022-05-12 DIAGNOSIS — E119 Type 2 diabetes mellitus without complications: Secondary | ICD-10-CM | POA: Insufficient documentation

## 2022-05-12 HISTORY — PX: FINE NEEDLE ASPIRATION: SHX5430

## 2022-05-12 HISTORY — PX: FLEXIBLE SIGMOIDOSCOPY: SHX5431

## 2022-05-12 HISTORY — PX: EUS: SHX5427

## 2022-05-12 HISTORY — PX: BIOPSY: SHX5522

## 2022-05-12 LAB — GLUCOSE, CAPILLARY
Glucose-Capillary: 132 mg/dL — ABNORMAL HIGH (ref 70–99)
Glucose-Capillary: 173 mg/dL — ABNORMAL HIGH (ref 70–99)

## 2022-05-12 SURGERY — ULTRASOUND, LOWER GI TRACT, ENDOSCOPIC
Anesthesia: Monitor Anesthesia Care

## 2022-05-12 MED ORDER — LIDOCAINE 2% (20 MG/ML) 5 ML SYRINGE
INTRAMUSCULAR | Status: DC | PRN
  Administered 2022-05-12: 100 mg via INTRAVENOUS

## 2022-05-12 MED ORDER — PROPOFOL 500 MG/50ML IV EMUL
INTRAVENOUS | Status: DC | PRN
  Administered 2022-05-12: 125 ug/kg/min via INTRAVENOUS

## 2022-05-12 MED ORDER — PROPOFOL 10 MG/ML IV BOLUS
INTRAVENOUS | Status: DC | PRN
  Administered 2022-05-12: 100 mg via INTRAVENOUS

## 2022-05-12 MED ORDER — CIPROFLOXACIN IN D5W 400 MG/200ML IV SOLN
INTRAVENOUS | Status: DC | PRN
  Administered 2022-05-12: 400 mg via INTRAVENOUS

## 2022-05-12 MED ORDER — SODIUM CHLORIDE 0.9 % IV SOLN: INTRAVENOUS | Status: DC

## 2022-05-12 MED ORDER — CIPROFLOXACIN IN D5W 400 MG/200ML IV SOLN
INTRAVENOUS | Status: AC
  Filled 2022-05-12: qty 200

## 2022-05-12 MED ORDER — CIPROFLOXACIN HCL 500 MG PO TABS: 500.0000 mg | ORAL_TABLET | Freq: Two times a day (BID) | ORAL | 0 refills | Status: AC

## 2022-05-12 MED ORDER — LACTATED RINGERS IV SOLN
INTRAVENOUS | Status: AC | PRN
  Administered 2022-05-12: 1000 mL via INTRAVENOUS

## 2022-05-12 NOTE — Anesthesia Preprocedure Evaluation (Addendum)
Anesthesia Evaluation  Patient identified by MRN, date of birth, ID band Patient awake    Reviewed: Allergy & Precautions, NPO status , Patient's Chart, lab work & pertinent test results  Airway Mallampati: II  TM Distance: >3 FB Neck ROM: Full    Dental no notable dental hx. (+) Teeth Intact, Dental Advisory Given   Pulmonary neg pulmonary ROS,    Pulmonary exam normal breath sounds clear to auscultation       Cardiovascular negative cardio ROS Normal cardiovascular exam Rhythm:Regular Rate:Normal     Neuro/Psych PSYCHIATRIC DISORDERS Anxiety negative neurological ROS     GI/Hepatic Neg liver ROS, GERD  ,  Endo/Other  diabetes, Type 2, Oral Hypoglycemic Agents  Renal/GU negative Renal ROS  negative genitourinary   Musculoskeletal negative musculoskeletal ROS (+)   Abdominal   Peds  Hematology negative hematology ROS (+)   Anesthesia Other Findings   Reproductive/Obstetrics                            Anesthesia Physical Anesthesia Plan  ASA: 2  Anesthesia Plan: MAC   Post-op Pain Management:    Induction: Intravenous  PONV Risk Score and Plan: Propofol infusion and Treatment may vary due to age or medical condition  Airway Management Planned: Natural Airway  Additional Equipment:   Intra-op Plan:   Post-operative Plan:   Informed Consent: I have reviewed the patients History and Physical, chart, labs and discussed the procedure including the risks, benefits and alternatives for the proposed anesthesia with the patient or authorized representative who has indicated his/her understanding and acceptance.     Dental advisory given  Plan Discussed with: CRNA  Anesthesia Plan Comments:         Anesthesia Quick Evaluation

## 2022-05-12 NOTE — Anesthesia Postprocedure Evaluation (Signed)
Anesthesia Post Note  Patient: Richard Rowland  Procedure(s) Performed: LOWER ENDOSCOPIC ULTRASOUND (EUS) FLEXIBLE SIGMOIDOSCOPY FINE NEEDLE ASPIRATION (FNA) LINEAR BIOPSY     Patient location during evaluation: PACU Anesthesia Type: MAC Level of consciousness: awake and alert Pain management: pain level controlled Vital Signs Assessment: post-procedure vital signs reviewed and stable Respiratory status: spontaneous breathing, nonlabored ventilation, respiratory function stable and patient connected to nasal cannula oxygen Cardiovascular status: stable and blood pressure returned to baseline Postop Assessment: no apparent nausea or vomiting Anesthetic complications: no   No notable events documented.  Last Vitals:  Vitals:   05/12/22 1143 05/12/22 1158  BP: 110/85 111/81  Pulse: 68 (!) 57  Resp: 14 15  Temp:    SpO2: 96% 100%    Last Pain:  Vitals:   05/12/22 1158  TempSrc:   PainSc: 0-No pain                 Laycie Schriner L [place] Weight

## 2022-05-12 NOTE — Op Note (Signed)
[facility] Patient Name: Richard Rowland Procedure Date : 05/12/2022 MRN: [id number] Attending MD: Justice Britain , MD Date of Birth: 01/03/1971 CSN: [id number] Age: 51 Admit Type: Outpatient Procedure:                Lower EUS Indications:              Sigmoid deformity found on endoscopy; subepithelial                            tumor versus extrinsic compression, Abnormal flex                            sig/colonoscopy Providers:                Justice Britain, MD, Doristine Johns, RN, Tyna Jaksch Technician, Darliss Cheney, Technician Referring MD:             Gladstone Pih. Candis Schatz, MD, Urbank White Medicines:                Monitored Anesthesia Care Complications:            No immediate complications. Estimated Blood Loss:     Estimated blood loss was minimal. Procedure:                Pre-Anesthesia Assessment:                           - Prior to the procedure, a History and Physical                            was performed, and patient medications and                            allergies were reviewed. The patient's tolerance of                            previous anesthesia was also reviewed. The risks                            and benefits of the procedure and the sedation                            options and risks were discussed with the patient.                            All questions were answered, and informed consent                            was obtained. Prior Anticoagulants: The patient has                            taken no previous anticoagulant or antiplatelet  agents. ASA Grade Assessment: II - A patient with                            mild systemic disease. After reviewing the risks                            and benefits, the patient was deemed in                            satisfactory condition to undergo the procedure.                           After obtaining informed consent,  the endoscope was                            passed under direct vision. Throughout the                            procedure, the patient's blood pressure, pulse, and                            oxygen saturations were monitored continuously. The                            [id number]-[id number] ([id number]) Olympus endoscope was introduced                            through the anus and advanced to the the splenic                            flexure. The [id number] ([id number]) Olympus Forward                            View EUS was introduced through the anus and                            advanced to the the sigmoid colon for ultrasound.                            The GF-[id number]-AL5 ([id number]) Olympus radial                            ultrasound scope was introduced through the anus                            and advanced to the the sigmoid colon for                            ultrasound. The lower EUS was technically difficult                            and complex. Successful completion of the procedure  was aided by performing the maneuvers documented                            (below) in this report. The quality of the bowel                            preparation was adequate. Scope In: Scope Out: Findings:      The digital rectal exam findings include hemorrhoids and slight       palpation anteriorly of the suspected lesion.      ENDOSCOPIC FINDING: :      Multiple small-mouthed diverticula were found in the sigmoid colon.      A submucosal non-obstructing medium-sized mass was found in the proximal       rectum/distal recto-sigmoid colon (at approxiamtely 9-12 cm from anal       os). The mass was partially circumferential (involving one-half of the       lumen circumference). The mass measured at least three cm in length. No       bleeding was present. After the rest of the sigmoidoscopy and EUS was       complete, tunneled biopsies were taken with a cold forceps for  histology.      Normal mucosa was found in the entire visualized colon.      Non-bleeding, non-thrombosed external and internal hemorrhoids were       found during retroflexion, during perianal exam and during digital exam.       The hemorrhoids were Grade II (internal hemorrhoids that prolapse but       reduce spontaneously).      ENDOSONOGRAPHIC FINDING: :      A rounded intramural (subepithelial) lesion was found at the       rectosigmoid junction. The lesion was semi-pedunculated, and       non-circumferential and located predominantly at the anterior bowel       wall. The lesion was hypoechoic. Sonographically, the origin appeared to       be within the submucosa (Layer 3) and the muscularis propria (Layer 4)       was tented upwards into the semi-pedunculated area of the lesion. The       lesion measured 29 mm by 27 mm in thickness. The endosonographic borders       were smooth. Fine needle biopsy was performed. Color Doppler imaging was       utilized prior to needle puncture to confirm a lack of significant       vascular structures within the needle path. Eight passes were made with       the Acquire 19 gauge ultrasound core biopsy needle using a transcolonic       approach. A visible core of tissue was obtained. Preliminary cytologic       examination and touch preps were performed. Final cytology results are       pending.      No malignant-appearing lymph nodes were visualized in the perirectal       region and in the left iliac region. Impression:               Sigmoidoscopy Impression:                           - Hemorrhoids found on digital rectal exam. Also  palpation of a part of the subepithelial lesion in                            the anterior position was felt.                           - Diverticulosis in the sigmoid colon.                           - Rule out malignancy, subepithelial lesion in the                            proximal  rectum/distal recto-sigmoid colon. After                            EUS, tunnel biopsies performed.                           - Normal mucosa in the entire visualized examined                            colon otherwise.                           - Non-bleeding non-thrombosed external and internal                            hemorrhoids.                           EUS Impression:                           - An intramural (subepithelial) lesion was                            visualized endosonographically at the rectosigmoid                            junction. Sonographically, the origin appeared to                            be within the submucosa (Layer 3) and has extension                            of the muscularis propria (Layer 4) from the                            semi-pedunculated stalk region. Fine needle biopsy                            performed of the lesion.                           - No malignant-appearing lymph nodes were  visualized endosonographically in the perirectal                            region and in the left iliac region. Recommendation:           - The patient will be observed post-procedure,                            until all discharge criteria are met.                           - Discharge patient to home.                           - Patient has a contact number available for                            emergencies. The signs and symptoms of potential                            delayed complications were discussed with the                            patient. Return to normal activities tomorrow.                            Written discharge instructions were provided to the                            patient.                           - Resume previous diet.                           - Await cytology results and await path results.                           - Expect some mild rectal bleeding for next 24                             hours that should subside. Call if any concerns at                            anytime.                           - Pending pathology will place referrals or                            rediscuss at [facility].                           - The findings and recommendations were discussed  with the patient.                           - The findings and recommendations were discussed                            with the patient's family. Procedure Code(s):        --- Professional ---                           [phone], Sigmoidoscopy, flexible; with                            transendoscopic ultrasound guided intramural or                            transmural fine needle aspiration/biopsy(s) Diagnosis Code(s):        --- Professional ---                           K64.1, Second degree hemorrhoids                           D49.0, Neoplasm of unspecified behavior of                            digestive system                           K63.89, Other specified diseases of intestine                           I89.9, Noninfective disorder of lymphatic vessels                            and lymph nodes, unspecified                           K57.30, Diverticulosis of large intestine without                            perforation or abscess without bleeding CPT copyright 2019 [employer]. All rights reserved. The codes documented in this report are preliminary and upon coder review may  be revised to meet current compliance requirements. Justice Britain, MD 05/12/2022 11:47:48 AM Number of Addenda: 0

## 2022-05-12 NOTE — Transfer of Care (Signed)
Immediate Anesthesia Transfer of Care Note  Patient: Richard Rowland  Procedure(s) Performed: LOWER ENDOSCOPIC ULTRASOUND (EUS) FLEXIBLE SIGMOIDOSCOPY FINE NEEDLE ASPIRATION (FNA) LINEAR BIOPSY  Patient Location: PACU  Anesthesia Type:MAC  Level of Consciousness: drowsy and patient cooperative  Airway & Oxygen Therapy: Patient Spontanous Breathing  Post-op Assessment: Report given to RN and Post -op Vital signs reviewed and stable  Post vital signs: Reviewed, stable  Last Vitals:  Vitals Value Taken Time  BP 105/72 05/12/22 1128  Temp 36.1 C 05/12/22 1128  Pulse 83 05/12/22 1131  Resp 9 05/12/22 1131  SpO2 95 % 05/12/22 1131  Vitals shown include unvalidated device data.  Last Pain:  Vitals:   05/12/22 0847  TempSrc: Temporal  PainSc: 0-No pain         Complications: No notable events documented.

## 2022-05-12 NOTE — H&P (Signed)
GASTROENTEROLOGY PROCEDURE H&P NOTE   Primary Care Physician: Jettie Booze, NP  HPI: Richard Rowland is a 51 y.o. male who presents for Flex Sigmoidoscopy with EUS to evaluate Rectosigmoid subepithelial lesion.  Past Medical History:  Diagnosis Date   GERD (gastroesophageal reflux disease)    Past Surgical History:  Procedure Laterality Date   EUS N/A 03/28/2022   Procedure: LOWER ENDOSCOPIC ULTRASOUND (EUS);  Surgeon: Irving Copas., MD;  Location: [facility];  Service: Gastroenterology;  Laterality: N/A;   FINE NEEDLE ASPIRATION BIOPSY  03/28/2022   Procedure: FINE NEEDLE ASPIRATION BIOPSY;  Surgeon: Rush Landmark Telford Nab., MD;  Location: [facility];  Service: Gastroenterology;;   Otho Darner SIGMOIDOSCOPY N/A 03/28/2022   Procedure: Beryle Quant;  Surgeon: Irving Copas., MD;  Location: [facility];  Service: Gastroenterology;  Laterality: N/A;   HERNIA REPAIR     age 58   POLYPECTOMY  03/28/2022   Procedure: POLYPECTOMY;  Surgeon: Mansouraty, Telford Nab., MD;  Location: [facility];  Service: Gastroenterology;;   SHOULDER ARTHROSCOPY W/ ROTATOR CUFF REPAIR Right 2016   SUBMUCOSAL TATTOO INJECTION  03/28/2022   Procedure: SUBMUCOSAL TATTOO INJECTION;  Surgeon: Irving Copas., MD;  Location: [facility];  Service: Gastroenterology;;   VASECTOMY  2009   Current Facility-Administered Medications  Medication Dose Route Frequency Provider Last Rate Last Admin   0.9 %  sodium chloride infusion   Intravenous Continuous Mansouraty, Telford Nab., MD       lactated ringers infusion    Continuous PRN Mansouraty, Telford Nab., MD 10 mL/hr at 05/12/22 0852 1,000 mL at 05/12/22 0852    Current Facility-Administered Medications:    0.9 %  sodium chloride infusion, , Intravenous, Continuous, Mansouraty, Telford Nab., MD   lactated ringers infusion, , , Continuous PRN, Mansouraty, Telford Nab., MD, Last Rate: 10 mL/hr at 05/12/22 0852, 1,000 mL at  05/12/22 0852 No Known Allergies Family History  Problem Relation Age of Onset   Colon cancer Neg Hx    Colon polyps Neg Hx    Social History   Socioeconomic History   Marital status: Divorced    Spouse name: Not on file   Number of children: Not on file   Years of education: Not on file   Highest education level: Not on file  Occupational History   Not on file  Tobacco Use   Smoking status: Never   Smokeless tobacco: Never  Vaping Use   Vaping Use: Never used  Substance and Sexual Activity   Alcohol use: Yes    Alcohol/week: 1.0 standard drink    Types: 1 Glasses of wine per week   Drug use: Not Currently    Comment: THC occ   Sexual activity: Not on file  Other Topics Concern   Not on file  Social History Narrative   Not on file   Social Determinants of Health   Financial Resource Strain: Not on file  Food Insecurity: Not on file  Transportation Needs: Not on file  Physical Activity: Not on file  Stress: Not on file  Social Connections: Not on file  Intimate Partner Violence: Not on file    Physical Exam: Today's Vitals   05/12/22 0847  BP: (!) 151/99  Pulse: 84  Resp: 14  Temp: 97.7 F (36.5 C)  TempSrc: Temporal  SpO2: 98%  Weight: 87.5 kg  Height: '5\' 9"'$  (1.753 m)  PainSc: 0-No pain   Body mass index is 28.5 kg/m. GEN: NAD EYE: Sclerae anicteric ENT: MMM CV: Non-tachycardic GI:  Soft, NT/ND NEURO:  Alert & Oriented x 3  Lab Results: No results for input(s): WBC, HGB, HCT, PLT in the last 72 hours. BMET No results for input(s): NA, K, CL, CO2, GLUCOSE, BUN, CREATININE, CALCIUM in the last 72 hours. LFT No results for input(s): PROT, ALBUMIN, AST, ALT, ALKPHOS, BILITOT, BILIDIR, IBILI in the last 72 hours. PT/INR No results for input(s): LABPROT, INR in the last 72 hours.   Impression / Plan: This is a 51 y.o.male who presents for Flex Sigmoidoscopy with EUS to evaluate Rectosigmoid subepithelial lesion.  The risks of an EUS including  intestinal perforation, bleeding, infection, aspiration, and medication effects were discussed as was the possibility it may not give a definitive diagnosis if a biopsy is performed.  The risks and benefits of endoscopic evaluation/treatment were discussed with the patient and/or family; these include but are not limited to the risk of perforation, infection, bleeding, missed lesions, lack of diagnosis, severe illness requiring hospitalization, as well as anesthesia and sedation related illnesses.  The patient's history has been reviewed, patient examined, no change in status, and deemed stable for procedure.  The patient and/or family is agreeable to proceed.    Justice Britain, MD [facility] Advanced [facility]oscopy Office # [phone]

## 2022-05-13 ENCOUNTER — Encounter (HOSPITAL_COMMUNITY): Payer: Self-pay | Admitting: Gastroenterology

## 2022-05-16 LAB — CYTOLOGY - NON PAP

## 2022-05-16 LAB — SURGICAL PATHOLOGY

## 2022-05-18 ENCOUNTER — Encounter: Payer: Self-pay | Admitting: Gastroenterology

## 2022-06-01 ENCOUNTER — Other Ambulatory Visit: Payer: Self-pay

## 2022-06-01 NOTE — Progress Notes (Signed)
The proposed treatment discussed in conference is for discussion purpose only and is not a binding recommendation.  The patients have not been physically examined, or presented with their treatment options.  Therefore, final treatment plans cannot be decided.  

## 2022-06-15 ENCOUNTER — Encounter: Payer: Self-pay | Admitting: Gastroenterology

## 2022-06-15 NOTE — Progress Notes (Signed)
Able to reach the patient this afternoon. Discussed the [place] conversation. He would lean towards not having surgery but is agreeable to at least have a conversation with our colorectal surgeons. We will place a referral to Dr. Jaynee Eagles. Thomas/Dr. Dema Severin to have discussion as to whether this leiomyoma of the very distal rectosigmoid versus very proximal rectum could be removed transanally (again I was able to palpate this on a very deep digital rectal exam).  If surveillance is going to be considered, then likely CT versus EUS follow-up will be considered.  We will determine a final plan after the colorectal surgeons meet the patient.  Patty, please place a referral to colorectal surgery.  FYI SEC

## 2022-06-15 NOTE — Progress Notes (Signed)
Case discussed at [facility] recently. Patient's endoscopic evaluation and biopsies have been reviewed as well as imaging as well. Patient's lesion is most consistent with a leiomyoma. In the setting of the size of the leiomyoma as well as the ulceration that is present (though ulceration may be from recent biopsies) it is felt that consideration and referral to colorectal surgery should be suggested to see whether resection transanally may be worth considering versus continued surveillance endoscopically.  I called to speak with the patient and update him about these results but had to leave a voicemail.  We will try to reach the patient again later this week to update him and see if he would like the referral to colorectal surgery or continue to follow surveillance with endoscopies/sigmoidoscopies.  FYI Patty if patient calls back.  FYI SEC about [place] discussion.  Justice Britain, MD [facility]oscopy Office # [phone]

## 2022-06-16 NOTE — Progress Notes (Signed)
Referral has been made to [facility] records faxed.  

## 2023-11-04 IMAGING — CT CT ABD-PELV W/ CM
2 of 5 series · 15 of 46 positions shown, 17 images · IV contrast (APPLIED)
Comparison: Remote CT 11/17/2010

CLINICAL DATA: Lesion of colon. Evaluate large 7 epithelial
polypoid lesion in the rectosigmoid colon.

EXAM:
CT ABDOMEN AND PELVIS WITH CONTRAST
TECHNIQUE: Multidetector CT imaging of the abdomen and pelvis was performed
using the standard protocol following bolus administration of
intravenous contrast.

[Series 2: axial st · axial · 0.83mm/px · z∈[+1062,+1542]mm · 12 of 110 slices shown, 14 images]
[im 7/110  soft-tissue]
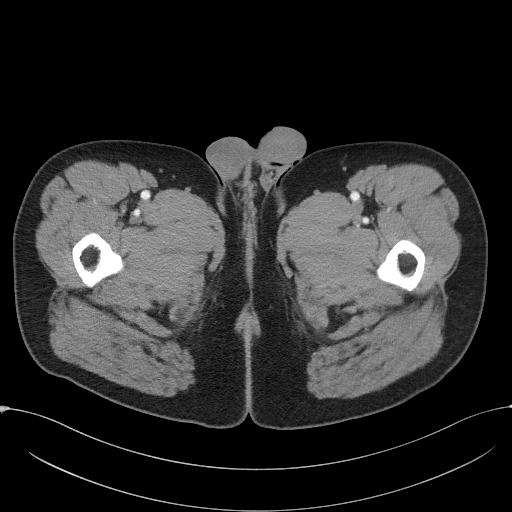
[im 7/110  bone]
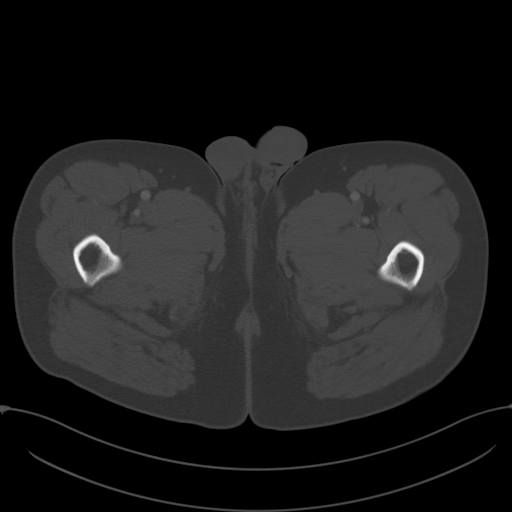
[im 14/110  soft-tissue]
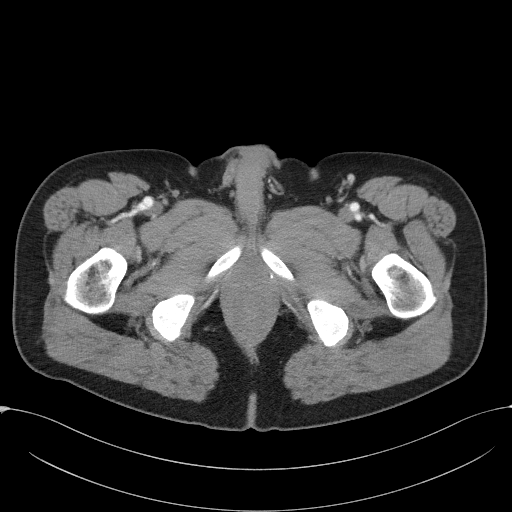
[im 28/110  soft-tissue]
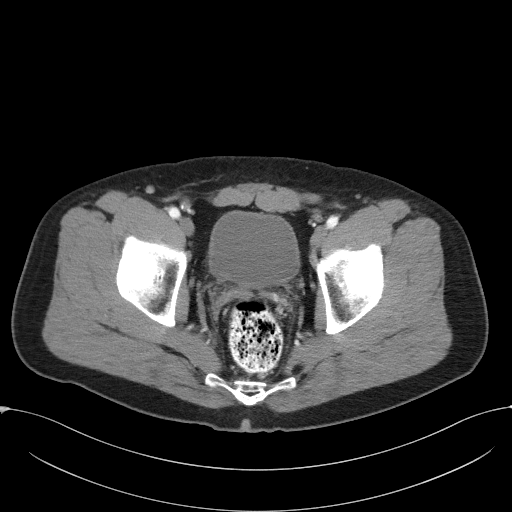
[im 35/110  soft-tissue]
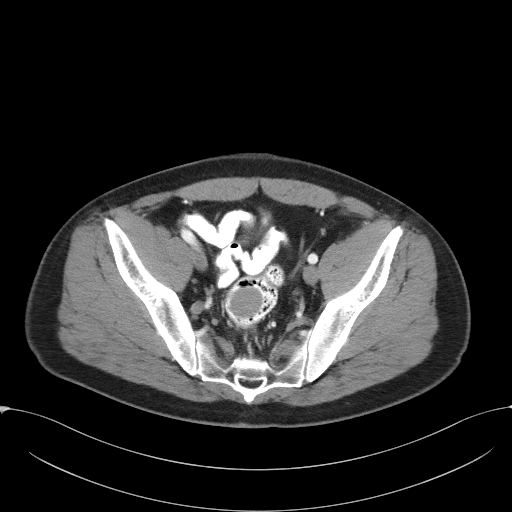
[im 41/110  soft-tissue]
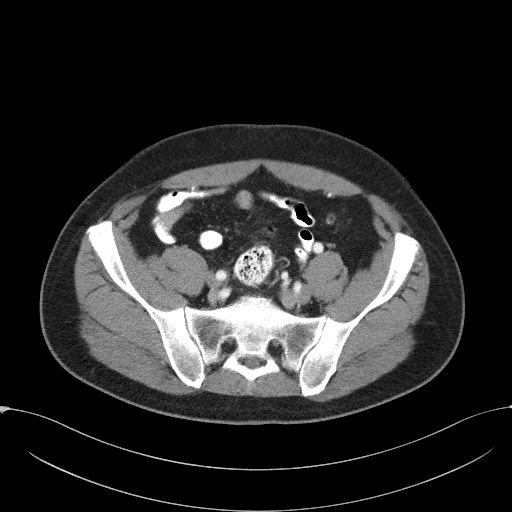
[im 48/110  soft-tissue]
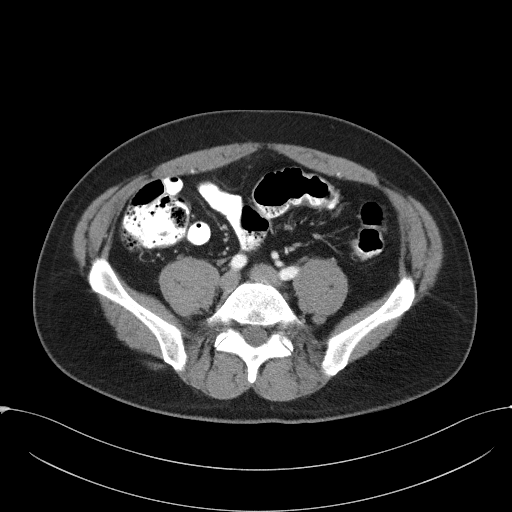
[im 62/110  soft-tissue]
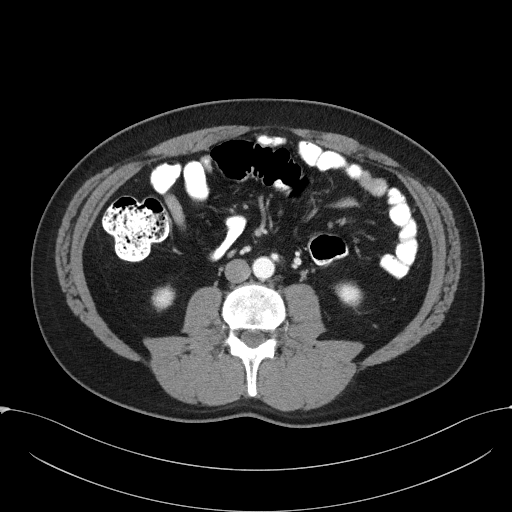
[im 69/110  soft-tissue]
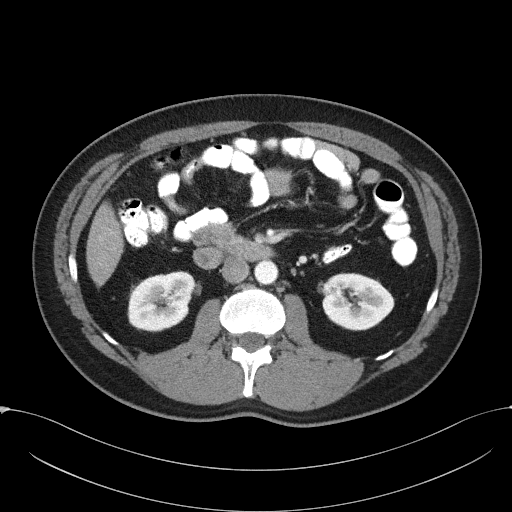
[im 75/110  soft-tissue]
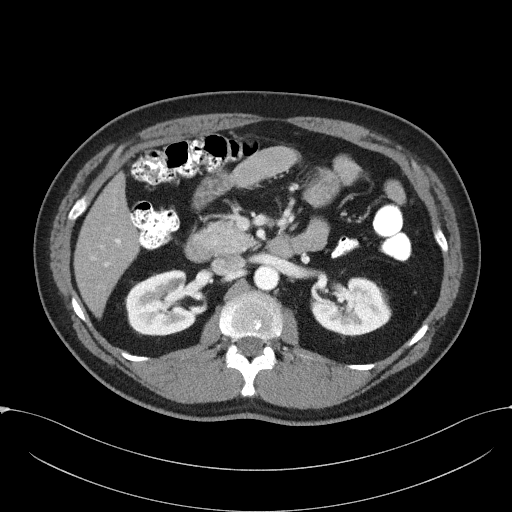
[im 75/110  bone]
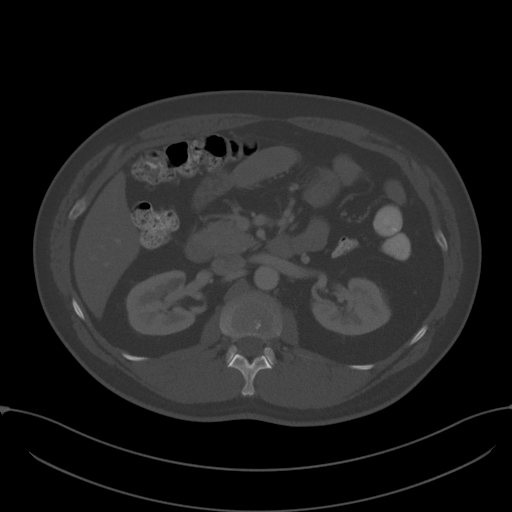
[im 82/110  soft-tissue]
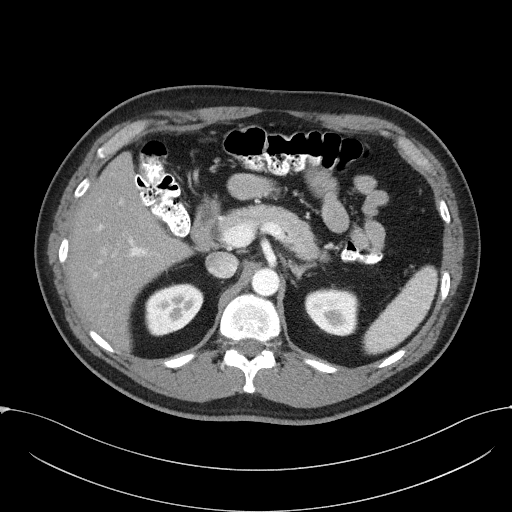
[im 96/110  soft-tissue]
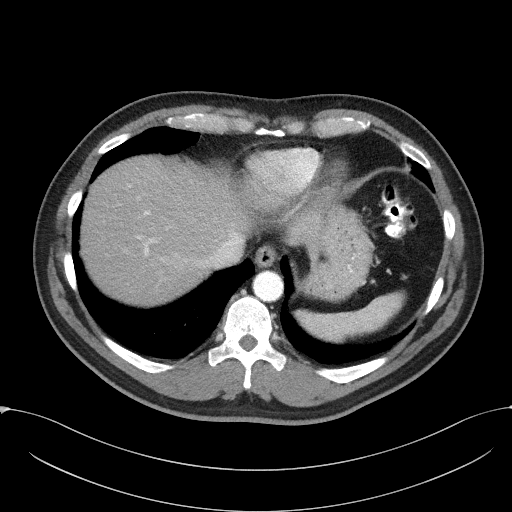
[im 103/110  soft-tissue]
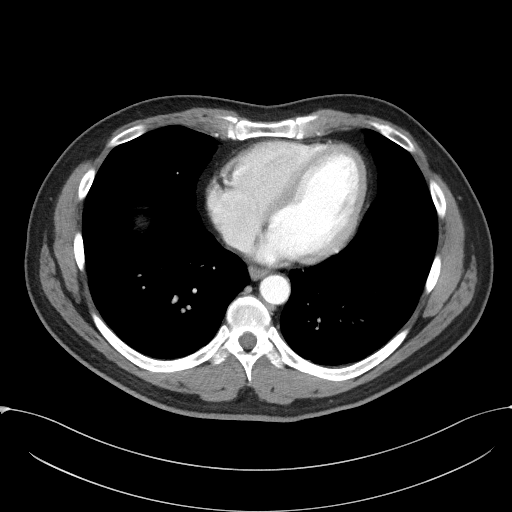

[Series 7: coronal st · coronal · 0.80mm/px · 3 of 97 slices shown]
[im 33/97  soft-tissue]
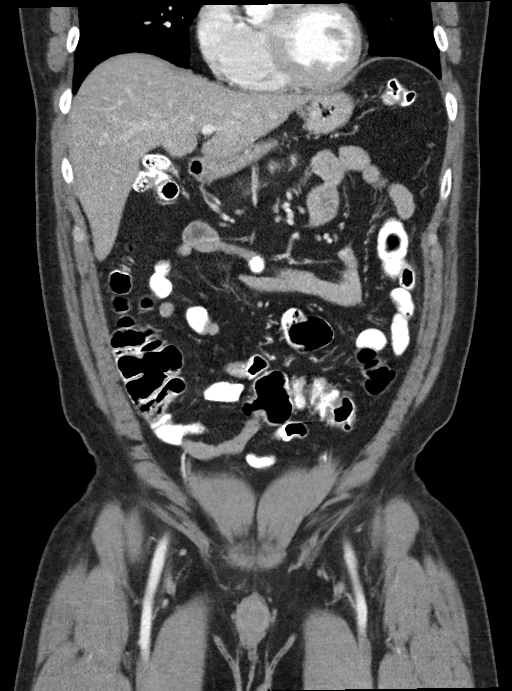
[im 43/97  soft-tissue]
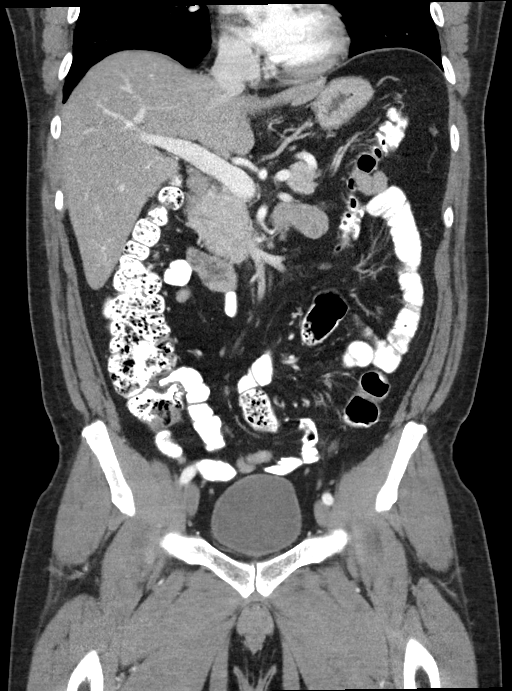
[im 54/97  soft-tissue]
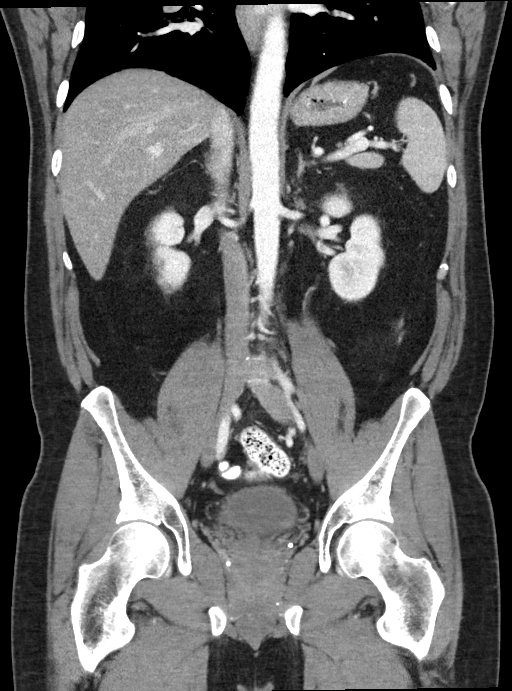

[15 of 46 positions shown; findings below may reference images not displayed]

RADIATION DOSE REDUCTION: This exam was performed according to the
departmental dose-optimization program which includes automated
exposure control, adjustment of the mA and/or kV according to
patient size and/or use of iterative reconstruction technique.

CONTRAST:  100mL OMNIPAQUE IOHEXOL 300 MG/ML  SOLN
FINDINGS: Lower chest: No focal airspace disease or pleural effusion. No
basilar pulmonary nodule. The heart is normal in size.

Hepatobiliary: Diffusely decreased hepatic density typical of
steatosis. There is no focal hepatic lesion. Normal liver size.
Clips in the gallbladder fossa postcholecystectomy. No biliary
dilatation.

Pancreas: No evidence of pancreatic mass. No ductal dilatation or
inflammation.

Spleen: Normal in size without focal abnormality.

Adrenals/Urinary Tract: Normal adrenal glands without adrenal
nodule. No hydronephrosis or perinephric edema. Homogeneous renal
enhancement with symmetric excretion on delayed phase imaging. No
focal renal lesion or stone. Urinary bladder is physiologically
distended without wall thickening.

Stomach/Bowel: There is a rounded smoothly marginated soft tissue
filling defect in the rectosigmoid colon measuring 2.7 cm, series 2,
image 67. This is homogeneous in density without internal
calcifications or complexity. No adjacent colonic wall thickening.
No colonic obstruction. Enteric contrast reaches distal to this
lesion. There is mild colonic redundancy. No other evident colonic
lesion. The appendix is not definitively seen. Normal appearing
small bowel no abnormal small bowel distension, obstruction, or
inflammatory change. No evidence of small bowel lesions.

Vascular/Lymphatic: Normal caliber abdominal aorta. Patent portal,
splenic, and mesenteric veins. There is no pericolonic adenopathy in
the region of colonic lesion. No pelvic, retroperitoneal, or
abdominal adenopathy.

Reproductive: Prostate is unremarkable.

Other: Tiny fat containing umbilical hernia. Minimal fat in the left
inguinal canal. No ascites. No mesenteric thickening or edema.

Musculoskeletal: There are no acute or suspicious osseous
abnormalities. No focal bone lesion. No abdominal wall soft tissue
abnormalities.
IMPRESSION: 1. Smoothly marginated soft tissue filling defect in the
rectosigmoid colon measuring 2.7 cm. This is homogeneous in density
without internal calcifications or complexity. No adjacent colonic
wall thickening or pericolonic adenopathy. Etiology is indeterminate
by CT.
2. No additional bowel lesions or evidence of metastatic disease in
the abdomen or pelvis.
3. Hepatic steatosis.

## 2024-07-17 ENCOUNTER — Other Ambulatory Visit (INDEPENDENT_AMBULATORY_CARE_PROVIDER_SITE_OTHER)

## 2024-07-17 ENCOUNTER — Encounter: Payer: Self-pay | Admitting: Gastroenterology

## 2024-07-17 ENCOUNTER — Ambulatory Visit: Admitting: Gastroenterology

## 2024-07-17 VITALS — BP 120/80 | HR 87 | Ht 69.0 in | Wt 187.0 lb

## 2024-07-17 DIAGNOSIS — Z800 Family history of malignant neoplasm of digestive organs: Secondary | ICD-10-CM

## 2024-07-17 DIAGNOSIS — K219 Gastro-esophageal reflux disease without esophagitis: Secondary | ICD-10-CM | POA: Diagnosis not present

## 2024-07-17 DIAGNOSIS — R110 Nausea: Secondary | ICD-10-CM | POA: Diagnosis not present

## 2024-07-17 DIAGNOSIS — K639 Disease of intestine, unspecified: Secondary | ICD-10-CM

## 2024-07-17 DIAGNOSIS — D214 Benign neoplasm of connective and other soft tissue of abdomen: Secondary | ICD-10-CM

## 2024-07-17 DIAGNOSIS — K625 Hemorrhage of anus and rectum: Secondary | ICD-10-CM | POA: Diagnosis not present

## 2024-07-17 LAB — COMPREHENSIVE METABOLIC PANEL WITH GFR
ALT: 37 U/L (ref 0–53)
AST: 23 U/L (ref 0–37)
Albumin: 4.4 g/dL (ref 3.5–5.2)
Alkaline Phosphatase: 48 U/L (ref 39–117)
BUN: 11 mg/dL (ref 6–23)
CO2: 29 meq/L (ref 19–32)
Calcium: 9.4 mg/dL (ref 8.4–10.5)
Chloride: 103 meq/L (ref 96–112)
Creatinine, Ser: 0.93 mg/dL (ref 0.40–1.50)
GFR: 93.89 mL/min (ref >60.00)
Glucose, Bld: 105 mg/dL — ABNORMAL HIGH (ref 70–99)
Potassium: 4.1 meq/L (ref 3.5–5.1)
Sodium: 140 meq/L (ref 135–145)
Total Bilirubin: 0.3 mg/dL (ref 0.2–1.2)
Total Protein: 7.4 g/dL (ref 6.0–8.3)

## 2024-07-17 LAB — TSH: TSH: 1.64 u[IU]/mL (ref 0.35–5.50)

## 2024-07-17 LAB — CORTISOL: Cortisol, Plasma: 6.8 ug/dL

## 2024-07-17 LAB — AMYLASE: Amylase: 276 U/L — ABNORMAL HIGH (ref 27–131)

## 2024-07-17 LAB — LIPASE: Lipase: 562 U/L — ABNORMAL HIGH (ref 11.0–59.0)

## 2024-07-17 NOTE — Progress Notes (Signed)
 GASTROENTEROLOGY OUTPATIENT CLINIC VISIT   Primary Care Provider Teresa Aldona CROME, NP [address] [place] [address] [phone]  Patient Profile: Richard Rowland is a 53 y.o. male with a pmh significant for Diabetes, hypertension, ADHD, GERD, colonic leiomyoma, family history esophagus cancer.  The patient presents to the [facility] for an evaluation and management of problem(s) noted below:  Problem List 1. Chronic nausea   2. Gastroesophageal reflux disease, unspecified whether esophagitis present   3. Submucosal leiomyoma of colon   4. Rectal bleeding   5. Family history of esophageal cancer    Discussed the use of AI scribe software for clinical note transcription with the patient, who gave verbal consent to proceed.  History of Present Illness Please see prior GI notes for full details of HPI.  Interval History Richard Rowland is a 53 year old male who presents for follow-up with recurring episodes of nausea without vomiting.  Previously he had seen Dr. Stacia for workup and sent to me for a colonic SEL that returned as a Leiomyoma.  We had referred to CRS to consider resection vs surveillance and he did not end up going to clinic to be evaluated.  Overall, he feels his health is good.  However, in the last 6 months, he experiences recurring episodes of sudden onset nausea. The nausea occurs about once a week, is not related to food intake, and lasts less than a minute. The episodes are intense enough to feel like he might vomit but resolve quickly. The most recent episode was two weeks ago and was less intense than previous ones. No burning sensation is associated with the nausea.  No abdominal pain has been noted.  He has a history of acid reflux for which he takes medication for and feels that this is well controlled.  He denies any dysphagia symptoms.  He reports stable weight, typically between 180 and 190 pounds, and has experienced  increased life stressors over the past two to three years and wonders if stress could be causing some issues for him.   He denies any significant changes in bowel habits but mentions rare episodes of blood in the stool, which he attributes to hemorrhoids.  Family history is significant for his younger brother's recent diagnosis of stage four esophageal cancer and this worries him overall.   GI Review of Systems Positive as above Negative for odynophagia, dysphagia, early satiety, change in bowel habits, melena  Review of Systems General: Denies fevers/chills/weight loss unintentionally Cardiovascular: Denies chest pain Pulmonary: Denies shortness of breath Gastroenterological: See HPI Genitourinary: Denies darkened urine Hematological: Denies easy bruising/bleeding Dermatological: Denies jaundice Psychological: Mood is stable  Medications Current Outpatient Medications  Medication Sig Dispense Refill   ADDERALL XR 25 MG 24 hr capsule Take 25 mg by mouth daily.     escitalopram (LEXAPRO) 10 MG tablet Take 10 mg by mouth daily.     famotidine (PEPCID) 20 MG tablet Take 20 mg by mouth 2 (two) times daily.     lisinopril (ZESTRIL) 10 MG tablet Take 10 mg by mouth daily.     LORazepam (ATIVAN) 1 MG tablet Take 1-2 mg by mouth at bedtime.     metFORMIN (GLUCOPHAGE-XR) 500 MG 24 hr tablet Take 500 mg by mouth in the morning and at bedtime.     Multiple Vitamin (MULTIVITAMIN) capsule Take 1 capsule by mouth 3 (three) times a week.     oxymetazoline (AFRIN) 0.05 % nasal spray Place 2 sprays into both  nostrils 2 (two) times daily as needed for congestion.     tadalafil (CIALIS) 5 MG tablet Take 5 mg by mouth daily as needed for erectile dysfunction.     No current facility-administered medications for this visit.    Allergies No Known Allergies  Histories Past Medical History:  Diagnosis Date   GERD (gastroesophageal reflux disease)    Past Surgical History:  Procedure Laterality Date    BIOPSY  05/12/2022   Procedure: BIOPSY;  Surgeon: Wilhelmenia Aloha Raddle., MD;  Location: [facility] ENDOSCOPY;  Service: Gastroenterology;;   EUS N/A 03/28/2022   Procedure: LOWER ENDOSCOPIC ULTRASOUND (EUS);  Surgeon: Wilhelmenia Aloha Raddle., MD;  Location: [facility];  Service: Gastroenterology;  Laterality: N/A;   EUS N/A 05/12/2022   Procedure: LOWER ENDOSCOPIC ULTRASOUND (EUS);  Surgeon: Wilhelmenia Aloha Raddle., MD;  Location: [facility] ENDOSCOPY;  Service: Gastroenterology;  Laterality: N/A;   FINE NEEDLE ASPIRATION  05/12/2022   Procedure: FINE NEEDLE ASPIRATION (FNA) LINEAR;  Surgeon: Wilhelmenia Aloha Raddle., MD;  Location: [facility] ENDOSCOPY;  Service: Gastroenterology;;   FINE NEEDLE ASPIRATION BIOPSY  03/28/2022   Procedure: FINE NEEDLE ASPIRATION BIOPSY;  Surgeon: Wilhelmenia Aloha Raddle., MD;  Location: [facility];  Service: Gastroenterology;;   ENID SIGMOIDOSCOPY N/A 03/28/2022   Procedure: ENID MORIN;  Surgeon: Wilhelmenia Aloha Raddle., MD;  Location: [facility];  Service: Gastroenterology;  Laterality: N/A;   FLEXIBLE SIGMOIDOSCOPY N/A 05/12/2022   Procedure: FLEXIBLE SIGMOIDOSCOPY;  Surgeon: Wilhelmenia Aloha Raddle., MD;  Location: [facility] ENDOSCOPY;  Service: Gastroenterology;  Laterality: N/A;   HERNIA REPAIR     age 27   POLYPECTOMY  03/28/2022   Procedure: POLYPECTOMY;  Surgeon: Mansouraty, Aloha Raddle., MD;  Location: [facility];  Service: Gastroenterology;;   SHOULDER ARTHROSCOPY W/ ROTATOR CUFF REPAIR Right 2016   SUBMUCOSAL TATTOO INJECTION  03/28/2022   Procedure: SUBMUCOSAL TATTOO INJECTION;  Surgeon: Wilhelmenia Aloha Raddle., MD;  Location: [facility];  Service: Gastroenterology;;   VASECTOMY  2009   Social History   Socioeconomic History   Marital status: Divorced    Spouse name: Not on file   Number of children: Not on file   Years of education: Not on file   Highest education level: Not on file  Occupational History   Not on file  Tobacco Use   Smoking status: Never    Smokeless tobacco: Never  Vaping Use   Vaping status: Never Used  Substance and Sexual Activity   Alcohol use: Yes    Alcohol/week: 1.0 standard drink of alcohol    Types: 1 Glasses of wine per week   Drug use: Not Currently    Comment: THC occ   Sexual activity: Not on file  Other Topics Concern   Not on file  Social History Narrative   Not on file   Social Drivers of Health   Financial Resource Strain: Low Risk  (01/10/2024)   Received from [facility]   Overall Financial Resource Strain (CARDIA)    Difficulty of Paying Living Expenses: Not very hard  Food Insecurity: No Food Insecurity (01/10/2024)   Received from [facility]   Hunger Vital Sign    Within the past 12 months, you worried that your food would run out before you got the money to buy more.: Never true    Within the past 12 months, the food you bought just didn't last and you didn't have money to get more.: Never true  Transportation Needs: No Transportation Needs (01/10/2024)   Received from [facility]  Administrator, Civil Service (Medical): No    Lack of Transportation (Non-Medical): No  Physical Activity: Insufficiently Active (01/10/2024)   Received from [facility]   Exercise Vital Sign    On average, how many days per week do you engage in moderate to strenuous exercise (like a brisk walk)?: 3 days    On average, how many minutes do you engage in exercise at this level?: 10 min  Stress: Stress Concern Present (01/10/2024)   Received from [facility] - Occupational Stress Questionnaire    Feeling of Stress : To some extent  Social Connections: Somewhat Isolated (01/10/2024)   Received from [facility]   Social Network    How would you rate your social network (family, work, friends)?: Restricted participation with some degree of social isolation  Intimate Partner Violence: Not At Risk (01/10/2024)   Received from [facility]    HITS    Over the last 12 months how often did your partner physically hurt you?: Never    Over the last 12 months how often did your partner insult you or talk down to you?: Rarely    Over the last 12 months how often did your partner threaten you with physical harm?: Never    Over the last 12 months how often did your partner scream or curse at you?: Rarely   Family History  Problem Relation Age of Onset   Esophageal cancer Brother    Colon cancer Neg Hx    Colon polyps Neg Hx    Inflammatory bowel disease Neg Hx    Liver disease Neg Hx    Pancreatic cancer Neg Hx    Rectal cancer Neg Hx    Stomach cancer Neg Hx    I have reviewed his medical, social, and family history in detail and updated the electronic medical record as necessary.    PHYSICAL EXAMINATION  BP 120/80   Pulse 87   Ht 5' 9 (1.753 m)   Wt 187 lb (84.8 kg)   BMI 27.62 kg/m  Wt Readings from Last 3 Encounters:  07/17/24 187 lb (84.8 kg)  05/12/22 193 lb (87.5 kg)  03/28/22 195 lb 1.7 oz (88.5 kg)  GEN: NAD, appears stated age, doesn't appear chronically ill PSYCH: Cooperative, without pressured speech EYE: Conjunctivae pink, sclerae anicteric ENT: MMM CV: Nontachycardic RESP: No audible wheezing GI: NABS, soft, NT/ND, without rebound or guarding MSK/EXT: No significant lower extremity edema SKIN: No jaundice NEURO:  Alert & Oriented x 3, no focal deficits   REVIEW OF DATA  I reviewed the following data at the time of this encounter:  GI Procedures and Studies  No new procedures to review  Laboratory Studies  Reviewed those in epic  Imaging Studies  No new studies to review   ASSESSMENT  Mr. Whitebread is a 53 y.o. male.  The patient is seen today for evaluation and management of:  1. Chronic nausea   2. Gastroesophageal reflux disease, unspecified whether esophagitis present   3. Submucosal leiomyoma of colon   4. Rectal bleeding   5. Family history of esophageal cancer    The patient  is hemodynamically stable and clinically overall stable.  His chronic intermittent nausea is not clearly defined from his known GERD symptomatology (feels different).  Possibility of thyroid  dysfunction or adrenal insufficiency or H. pylori infection needs to be ruled out and we will move forward with that.  Although less likely with his  A1c being as good as it is, gastroparesis could be causing some issues but I would expect that if that were the case we would likely have more vomiting episodes.  Will ensure that there is no pancreatic disease/disorder as well with laboratory testing.  Upper endoscopy is recommended for further evaluation as well of these progressive and persisting symptoms if workup is unremarkable.  Patient's history of colonic leiomyoma with consideration of potential surgical resection, while he did not follow through with that, require surveillance.  Will plan lower EUS evaluation for surveillance of this and consider the role of surgical interventions in future.  The risks and benefits of endoscopic evaluation were discussed with the patient; these include but are not limited to the risk of perforation, infection, bleeding, missed lesions, lack of diagnosis, severe illness requiring hospitalization, as well as anesthesia and sedation related illnesses.  The patient and/or family is agreeable to proceed.  All patient questions were answered to the best of my ability, and the patient agrees to the aforementioned plan of action with follow-up as indicated.   PLAN  Laboratories as outlined below H. pylori stool Diatherix testing Continue Pepcid once or twice daily as needed Diagnostic endoscopy to be pursued Surveillance colonoscopy to be pursued - If leiomyomas is increasing size or other concerns may need repeat sampling or again referral to colorectal surgery   Orders Placed This Encounter  Procedures   Procedural/ Surgical Case Request: ULTRASOUND, LOWER GI TRACT, ENDOSCOPIC,  EGD (ESOPHAGOGASTRODUODENOSCOPY)   Comp Met (CMET)   Amylase   Lipase   TSH   Cortisol   Other/Misc lab test   Ambulatory referral to Gastroenterology    New Prescriptions   No medications on file   Modified Medications   No medications on file    Planned Follow Up No follow-ups on file.   Total Time in Face-to-Face and in Coordination of Care for patient including independent/personal interpretation/review of prior testing, medical history, examination, medication adjustment, communicating results with the patient directly, and documentation within the EHR is 25 minutes.   Aloha Finner, MD [facility] Advanced Endoscopy Office # [phone]

## 2024-07-17 NOTE — Patient Instructions (Signed)
 Your provider has requested that you go to the basement level for lab work before leaving today. Press B on the elevator. The lab is located at the first door on the left as you exit the elevator.  Your provider has ordered Diatherix stool testing for you. You have received a kit from our office today containing all necessary supplies to complete this test. Please carefully read the stool collection instructions provided in the kit before opening the accompanying materials. In addition, be sure there is a label providing your full name and date of birth on the puritan opti-swab tube that is supplied in the kit (if you do not see a label with this information on your test tube, please make us  aware before test collection!). After completing the test, you should secure the purtian tube into the specimen biohazard bag. The [facility] E-Req sheet (including date and time of specimen collection) should be placed into the outside pocket of the specimen biohazard bag and returned to the [facility] lab (basement floor of [facility]) within 3 days of collection. Please make sure to give the specimen to a staff member at the lab. DO NOT leave the specimen on the counter.   If the specimen date and time (can be found in the upper right boxed portion of the sheet) are not filled out on the E-Req sheet, the test will NOT be performed.   You have been scheduled for an endoscopy. Please follow written instructions given to you at your visit today.  If you use inhalers (even only as needed), please bring them with you on the day of your procedure.  If you take any of the following medications, they will need to be adjusted prior to your procedure:   DO NOT TAKE 7 DAYS PRIOR TO TEST- Trulicity (dulaglutide) Ozempic, Wegovy (semaglutide) Mounjaro (tirzepatide) Bydureon Bcise (exanatide extended release)  DO NOT TAKE 1 DAY PRIOR TO YOUR TEST Rybelsus (semaglutide) Adlyxin  (lixisenatide) Victoza (liraglutide) Byetta (exanatide) ___________________________________________________________________________   _______________________________________________________  If your blood pressure at your visit was 140/90 or greater, please contact your primary care physician to follow up on this.  _______________________________________________________  If you are age 69 or older, your body mass index should be between 23-30. Your Body mass index is 27.62 kg/m. If this is out of the aforementioned range listed, please consider follow up with your Primary Care Provider.  If you are age 43 or younger, your body mass index should be between 19-25. Your Body mass index is 27.62 kg/m. If this is out of the aformentioned range listed, please consider follow up with your Primary Care Provider.   ________________________________________________________  The [facility] providers would like to encourage you to use MYCHART to communicate with providers for non-urgent requests or questions.  Due to long hold times on the telephone, sending your provider a message by [facility] may be a faster and more efficient way to get a response.  Please allow 48 business hours for a response.  Please remember that this is for non-urgent requests.  _______________________________________________________  [facility] is using a team-based approach to care.  Your team is made up of your doctor and two to three APPS. Our APPS (Nurse Practitioners and Physician Assistants) work with your physician to ensure care continuity for you. They are fully qualified to address your health concerns and develop a treatment plan. They communicate directly with your gastroenterologist to care for you. Seeing the Advanced Practice Practitioners on your physician's  team can help you by facilitating care more promptly, often allowing for earlier appointments, access to diagnostic testing, procedures, and other  specialty referrals.   Due to recent changes in healthcare laws, you may see the results of your imaging and laboratory studies on MyChart before your provider has had a chance to review them.  We understand that in some cases there may be results that are confusing or concerning to you. Not all laboratory results come back in the same time frame and the provider may be waiting for multiple results in order to interpret others.  Please give us  48 hours in order for your provider to thoroughly review all the results before contacting the office for clarification of your results.   Thank you for choosing me and [facility].  Dr. Wilhelmenia

## 2024-07-18 ENCOUNTER — Ambulatory Visit: Payer: Self-pay | Admitting: Gastroenterology

## 2024-07-18 ENCOUNTER — Other Ambulatory Visit: Payer: Self-pay

## 2024-07-18 DIAGNOSIS — R110 Nausea: Secondary | ICD-10-CM

## 2024-07-18 DIAGNOSIS — R748 Abnormal levels of other serum enzymes: Secondary | ICD-10-CM

## 2024-07-20 ENCOUNTER — Encounter: Payer: Self-pay | Admitting: Gastroenterology

## 2024-07-20 DIAGNOSIS — R110 Nausea: Secondary | ICD-10-CM | POA: Insufficient documentation

## 2024-07-20 DIAGNOSIS — K625 Hemorrhage of anus and rectum: Secondary | ICD-10-CM | POA: Insufficient documentation

## 2024-07-20 DIAGNOSIS — Z800 Family history of malignant neoplasm of digestive organs: Secondary | ICD-10-CM | POA: Insufficient documentation

## 2024-07-20 DIAGNOSIS — K219 Gastro-esophageal reflux disease without esophagitis: Secondary | ICD-10-CM | POA: Insufficient documentation

## 2024-07-20 DIAGNOSIS — D214 Benign neoplasm of connective and other soft tissue of abdomen: Secondary | ICD-10-CM | POA: Insufficient documentation

## 2024-07-25 ENCOUNTER — Ambulatory Visit (HOSPITAL_COMMUNITY)

## 2024-09-11 ENCOUNTER — Telehealth: Payer: Self-pay | Admitting: Gastroenterology

## 2024-09-11 NOTE — Telephone Encounter (Addendum)
 Procedure: Lower EUS/Endoscopy Procedure date: 09/19/24 Procedure location: [facility] Arrival Time: 8:00 am Spoke with the patient Y/N:   No, I left a detailed message on [phone] on 09/11/24 @ 1:10 pm for the patient to return call   No, I left a detailed message on [phone] on 09/12/24 @ 10:54 am for the patient to return call., Mychart message sent. No, I left a detailed message on [phone] on 09/13/24 @ 11:57 am for the patient to return call    Any prep concerns? ___  Has the patient obtained the prep from the pharmacy ? ___ Do you have a care partner and transportation: ___ Any additional concerns? ___

## 2024-09-12 ENCOUNTER — Encounter (HOSPITAL_COMMUNITY): Payer: Self-pay | Admitting: Gastroenterology

## 2024-09-19 ENCOUNTER — Ambulatory Visit (HOSPITAL_COMMUNITY): Admission: RE | Admit: 2024-09-19 | Payer: Self-pay | Source: Home / Self Care | Admitting: Gastroenterology

## 2024-09-19 ENCOUNTER — Encounter (HOSPITAL_COMMUNITY): Payer: Self-pay | Admitting: Anesthesiology

## 2024-09-19 ENCOUNTER — Encounter (HOSPITAL_COMMUNITY): Admission: RE | Payer: Self-pay | Source: Home / Self Care

## 2024-09-19 SURGERY — ULTRASOUND, LOWER GI TRACT, ENDOSCOPIC
Anesthesia: Monitor Anesthesia Care

## 2024-09-19 MED ORDER — PROPOFOL 1000 MG/100ML IV EMUL
INTRAVENOUS | Status: AC
  Filled 2024-09-19: qty 100

## 2024-09-19 NOTE — Anesthesia Preprocedure Evaluation (Signed)
 Anesthesia Evaluation  Patient identified by MRN, date of birth, ID band Patient awake    Reviewed: Allergy & Precautions, NPO status , Patient's Chart, lab work & pertinent test results  History of Anesthesia Complications Negative for: history of anesthetic complications  Airway        Dental   Pulmonary           Cardiovascular hypertension,      Neuro/Psych    GI/Hepatic ,GERD  Medicated,,  Endo/Other  diabetes, Type 2    Renal/GU      Musculoskeletal   Abdominal   Peds  Hematology   Anesthesia Other Findings   Reproductive/Obstetrics                              Anesthesia Physical Anesthesia Plan  ASA: 2  Anesthesia Plan: MAC   Post-op Pain Management:    Induction: Intravenous  PONV Risk Score and Plan: 1  Airway Management Planned: Simple Face Mask  Additional Equipment:   Intra-op Plan:   Post-operative Plan:   Informed Consent:      Dental advisory given  Plan Discussed with: CRNA and Surgeon  Anesthesia Plan Comments:         Anesthesia Quick Evaluation

## 2024-09-19 NOTE — Progress Notes (Signed)
 Left voicemail for pt to see if he is coming in for his procedure today.

## 2024-09-19 NOTE — Progress Notes (Signed)
 Patient no show for procedure. Patient has not returned call from earlier.

## 2024-09-30 ENCOUNTER — Telehealth: Payer: Self-pay | Admitting: *Deleted

## 2024-09-30 NOTE — Telephone Encounter (Signed)
 Left message for patient to call back

## 2024-09-30 NOTE — Telephone Encounter (Signed)
===  View-only below this line=== ----- Message ----- From: Wilhelmenia Aloha Raddle., MD Sent: 09/30/2024   8:06 AM EDT To: Glendia FORBES Holt, MD; [facility] Triage Subject: RE: Inpatient Notes                            Please keep me up-to-date. GM ----- Message ----- From: Holt Glendia FORBES, MD Sent: 09/30/2024   6:17 AM EDT To: Aloha Wilhelmenia Raddle., MD; [facility] Triage Subject: RE: Inpatient Notes                            Team,  Can you please reach out to this patient and schedule an office visit with me?  Thanks ----- Message ----- From: Wilhelmenia Aloha Raddle., MD Sent: 09/20/2024   5:49 AM EDT To: Odetta LITTIE Curly, RN; Darice JONETTA Blacker, RN; Eddy* Subject: RE: Inpatient Notes                            Thanks Darice.  Patty, I'm not sure what has happened to this patient.  We saw him in clinic and he wanted this to be done.  Now unable to communicate will reach him with the no-show. He needs to be seen in clinic by Dr. Holt, before he is rescheduled for procedures, to confirm if he wants to move forward with procedures or not. Thanks. GM  FYI SEC ----- Message ----- From: Blacker Darice JONETTA, RN Sent: 09/19/2024  11:09 AM EDT To: Odetta LITTIE Curly, RN; Aloha Wilhelmenia Raddle., * Subject: Inpatient Notes                                See note. Patient no show

## 2024-10-01 NOTE — Telephone Encounter (Signed)
 Attempted to reach patient by phone. No answer, voicemail is now full.

## 2024-10-02 NOTE — Telephone Encounter (Signed)
 Left additional message to call back. I will also send a mychart message since it appears patient does periodically review mychart account.
